# Patient Record
Sex: Male | Born: 1935 | Race: Black or African American | Hispanic: No | Marital: Married | Smoking: Current every day smoker
Health system: Southern US, Community
[De-identification: ages and names within clinical notes are randomized; demographics above are authoritative.]

## PROBLEM LIST (undated history)

## (undated) DIAGNOSIS — I739 Peripheral vascular disease, unspecified: Secondary | ICD-10-CM

## (undated) DIAGNOSIS — F1910 Other psychoactive substance abuse, uncomplicated: Secondary | ICD-10-CM

## (undated) DIAGNOSIS — E785 Hyperlipidemia, unspecified: Secondary | ICD-10-CM

## (undated) HISTORY — DX: Hyperlipidemia, unspecified: E78.5

## (undated) HISTORY — DX: Other psychoactive substance abuse, uncomplicated: F19.10

## (undated) HISTORY — DX: Peripheral vascular disease, unspecified: I73.9

---

## 2006-09-19 ENCOUNTER — Ambulatory Visit: Payer: Self-pay | Admitting: Internal Medicine

## 2007-08-08 ENCOUNTER — Ambulatory Visit: Payer: Self-pay | Admitting: Cardiology

## 2007-08-16 ENCOUNTER — Ambulatory Visit: Payer: Self-pay

## 2007-08-28 ENCOUNTER — Ambulatory Visit: Payer: Self-pay | Admitting: Cardiology

## 2007-08-28 LAB — CONVERTED CEMR LAB
Calcium: 10.2 mg/dL (ref 8.4–10.5)
Creatinine, Ser: 1.4 mg/dL (ref 0.4–1.5)
GFR calc Af Amer: 64 mL/min
Glucose, Bld: 96 mg/dL (ref 70–99)
HCT: 46 % (ref 39.0–52.0)
Hemoglobin: 15.8 g/dL (ref 13.0–17.0)
INR: 1 (ref 0.8–1.0)
MCHC: 34.5 g/dL (ref 30.0–36.0)
Monocytes Absolute: 0.3 10*3/uL (ref 0.1–1.0)
Monocytes Relative: 2.5 % — ABNORMAL LOW (ref 3.0–12.0)
Neutro Abs: 5.9 10*3/uL (ref 1.4–7.7)
RDW: 13.4 % (ref 11.5–14.6)
Sodium: 140 meq/L (ref 135–145)

## 2007-08-30 ENCOUNTER — Ambulatory Visit: Payer: Self-pay | Admitting: Cardiovascular Disease

## 2007-08-30 ENCOUNTER — Inpatient Hospital Stay (HOSPITAL_BASED_OUTPATIENT_CLINIC_OR_DEPARTMENT_OTHER): Admission: RE | Admit: 2007-08-30 | Discharge: 2007-08-30 | Payer: Self-pay | Admitting: Cardiovascular Disease

## 2007-09-18 ENCOUNTER — Ambulatory Visit: Payer: Self-pay | Admitting: Cardiovascular Disease

## 2007-09-18 ENCOUNTER — Ambulatory Visit: Payer: Self-pay | Admitting: Cardiology

## 2007-09-19 ENCOUNTER — Ambulatory Visit: Payer: Self-pay | Admitting: Vascular Surgery

## 2007-09-29 ENCOUNTER — Ambulatory Visit: Payer: Self-pay | Admitting: Internal Medicine

## 2007-09-29 DIAGNOSIS — E1169 Type 2 diabetes mellitus with other specified complication: Secondary | ICD-10-CM | POA: Insufficient documentation

## 2007-09-29 DIAGNOSIS — E785 Hyperlipidemia, unspecified: Secondary | ICD-10-CM | POA: Insufficient documentation

## 2007-09-29 DIAGNOSIS — J984 Other disorders of lung: Secondary | ICD-10-CM

## 2007-10-05 ENCOUNTER — Ambulatory Visit (HOSPITAL_COMMUNITY): Admission: RE | Admit: 2007-10-05 | Discharge: 2007-10-05 | Payer: Self-pay | Admitting: Internal Medicine

## 2007-12-21 ENCOUNTER — Ambulatory Visit: Payer: Self-pay

## 2008-03-01 HISTORY — PX: JOINT REPLACEMENT: SHX530

## 2008-04-02 ENCOUNTER — Telehealth (INDEPENDENT_AMBULATORY_CARE_PROVIDER_SITE_OTHER): Payer: Self-pay | Admitting: *Deleted

## 2008-04-10 ENCOUNTER — Ambulatory Visit: Payer: Self-pay | Admitting: Cardiology

## 2008-04-12 ENCOUNTER — Telehealth (INDEPENDENT_AMBULATORY_CARE_PROVIDER_SITE_OTHER): Payer: Self-pay | Admitting: *Deleted

## 2008-05-21 ENCOUNTER — Ambulatory Visit: Payer: Self-pay | Admitting: Vascular Surgery

## 2008-05-23 ENCOUNTER — Encounter: Admission: RE | Admit: 2008-05-23 | Discharge: 2008-05-23 | Payer: Self-pay | Admitting: Vascular Surgery

## 2008-06-24 ENCOUNTER — Inpatient Hospital Stay (HOSPITAL_COMMUNITY): Admission: RE | Admit: 2008-06-24 | Discharge: 2008-06-26 | Payer: Self-pay | Admitting: Orthopedic Surgery

## 2008-11-07 ENCOUNTER — Ambulatory Visit: Payer: Self-pay | Admitting: Cardiovascular Disease

## 2008-11-21 ENCOUNTER — Ambulatory Visit: Payer: Self-pay | Admitting: Internal Medicine

## 2009-02-12 ENCOUNTER — Ambulatory Visit: Payer: Self-pay | Admitting: Internal Medicine

## 2009-02-12 ENCOUNTER — Encounter: Payer: Self-pay | Admitting: Internal Medicine

## 2009-02-12 DIAGNOSIS — J441 Chronic obstructive pulmonary disease with (acute) exacerbation: Secondary | ICD-10-CM | POA: Insufficient documentation

## 2009-02-12 DIAGNOSIS — F17200 Nicotine dependence, unspecified, uncomplicated: Secondary | ICD-10-CM | POA: Insufficient documentation

## 2009-02-24 ENCOUNTER — Telehealth (INDEPENDENT_AMBULATORY_CARE_PROVIDER_SITE_OTHER): Payer: Self-pay | Admitting: *Deleted

## 2009-05-05 IMAGING — CT CT ANGIO AOBIFEM WO/W CM
2 of 5 series · 11 of 33 positions shown · IV contrast ([ID] OMNI 350)
Comparison: PET CT from 10/05/2007

CLINICAL DATA: 74-year-old with peripheral vascular disease.
Bilateral calf pain with walking.  History of femoral-femoral
bypass graft.

CT ANGIOGRAPHY OF ABDOMINAL AORTA WITH ILIOFEMORAL RUNOFF
TECHNIQUE: Multidetector CT imaging of the abdomen, pelvis and
lower extremities was performed using the standard protocol during
bolus administration of intravenous contrast. Multiplanar CT image
reconstructions including MIPs were obtained to evaluate the
vascular anatomy.
Contrast: 125 ml Nmnipaque-8AA 50

[Series 5: runoff · axial · 0.74mm/px · z∈[-1147,-119]mm · 10 of 485 slices shown]
[im 45/485  soft-tissue]
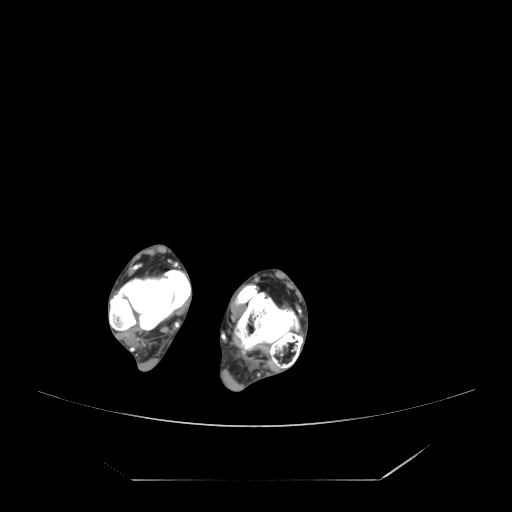
[im 89/485  bone]
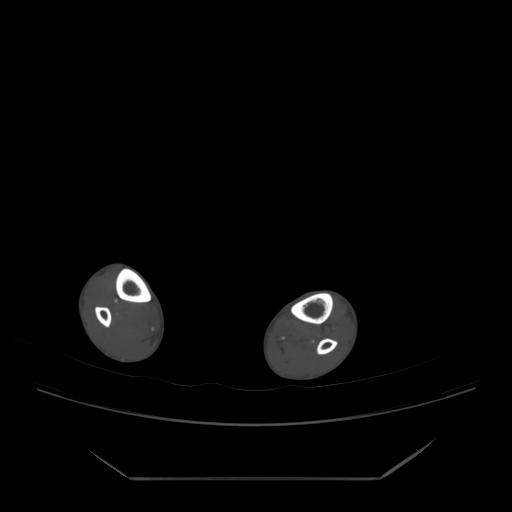
[im 133/485  soft-tissue]
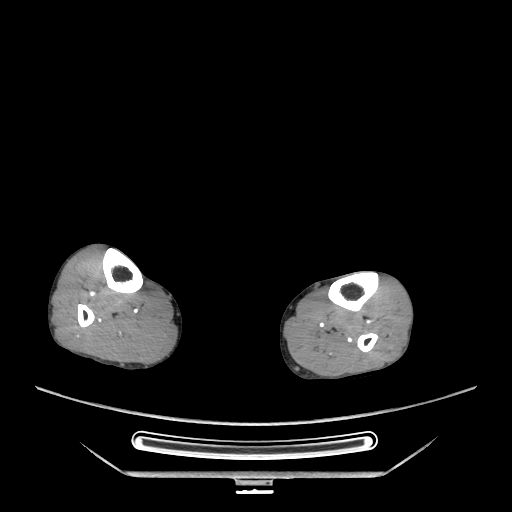
[im 177/485  bone]
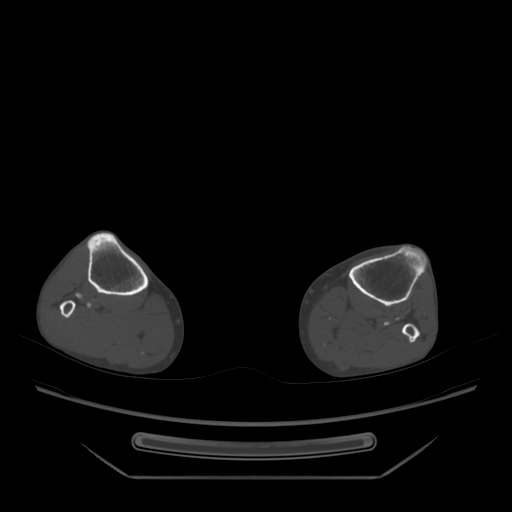
[im 221/485  soft-tissue]
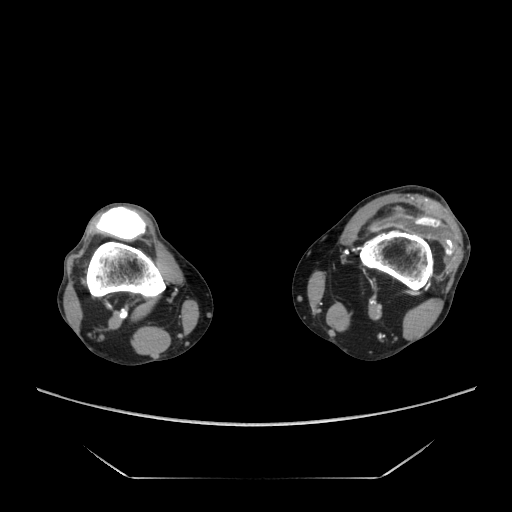
[im 265/485  bone]
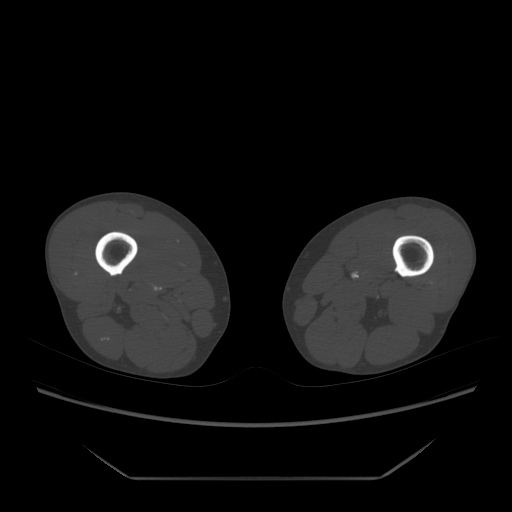
[im 309/485  soft-tissue]
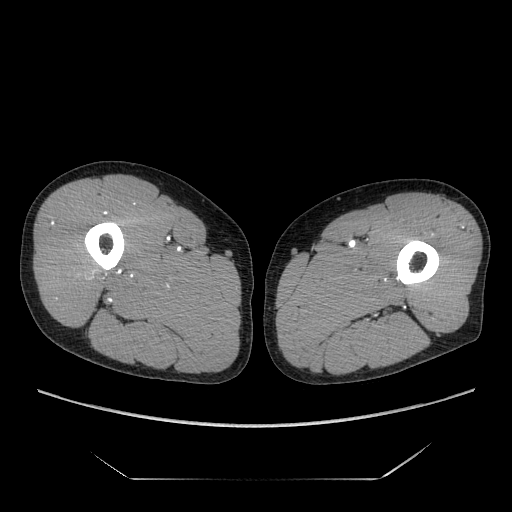
[im 353/485  bone]
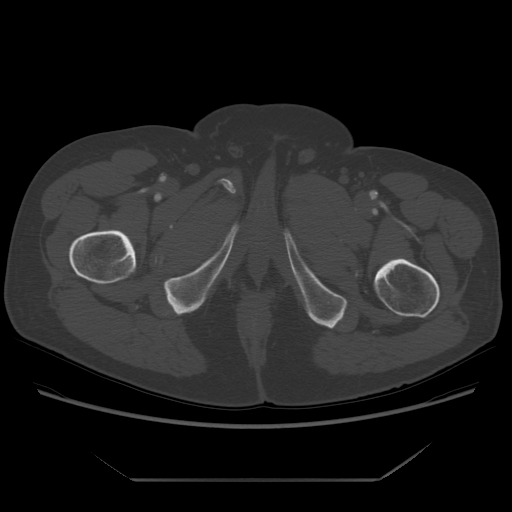
[im 397/485  soft-tissue]
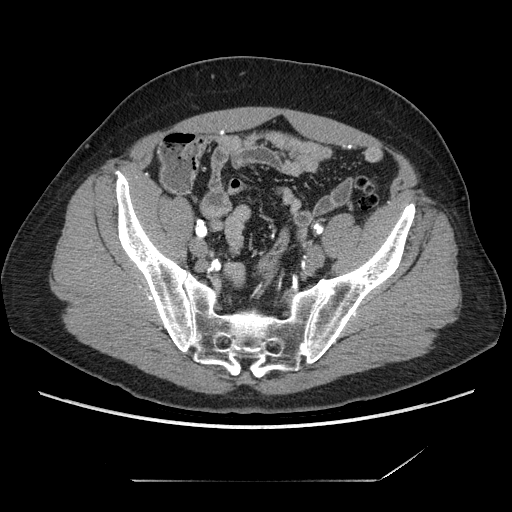
[im 441/485  bone]
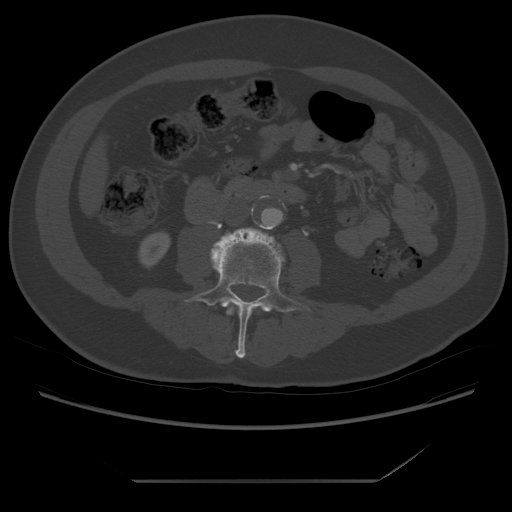

[Series 401: sag angio · sagittal · 2.50mm/px · 1 of 191 slices shown]
[im 96/191  soft-tissue]
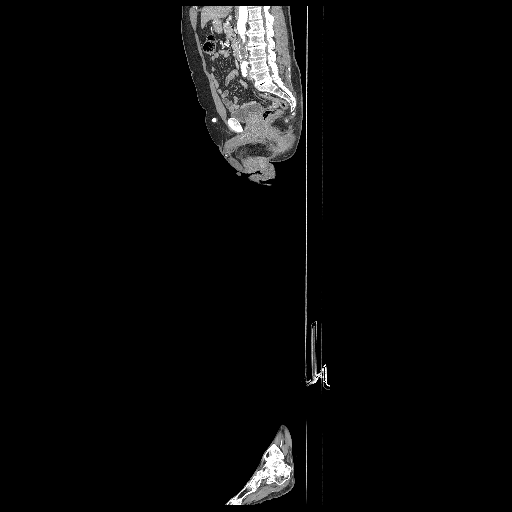

[11 of 33 positions shown; findings below may reference images not displayed]

FINDINGS: Aorta: The abdominal aorta is patent.  The celiac trunk, superior
mesenteric artery and bilateral renal arteries are widely patent.
There is probably distal reconstitution of the inferior mesenteric
artery because the origin of the IMA does not appear to be
opacified.  There is aneurysmal dilatation of the infrarenal
abdominal aorta with extensive mural thrombus particularly along
the anterior and lateral aspects.  The aorta measures 2.9 x 3 cm.
The aorta size has minimally change since 10/05/2007.  There is
complete occlusion of the left common iliac artery with distal
reconstitution of the left external and internal iliac arteries.
The proximal right common iliac artery measures 1.6 cm.  There may
be a focal dissection or focal mural thrombus involving the medial
aspect of the right common iliac artery.  There is mild narrowing
of the right common iliac artery with a large amount plaque
involving the right iliac bifurcation.  The right external and
internal iliac arteries are patent.  The patient does have a patent
femoral-femoral bypass graft.

Right Lower Extremity: There is a small area of dilatation along
the medial and posterior aspect of the right common femoral artery
which may represent a small pseudoaneurysm formation.  The proximal
right superficial femoral artery and profunda femoral arteries are
patent.  There is a segment of stenosis involving the mid right
superficial femoral artery that roughly measures 6 cm in length.
Just beyond the long segment stenosis, there is a critical area of
stenosis and occlusion of the superficial femoral artery with some
distal reconstitution.  The right popliteal artery is patent.
There is a three-vessel runoff in the right lower leg.  Flow across
ankle from the dorsalis pedis artery and posterior tibial artery.

Left Lower Extremity: The left common femoral artery is patent.
The proximal left superficial femoral artery and profunda femoral
arteries are patent.  There is mild narrowing of the distal left
SFA.  There is a critical area of stenosis near the adductor canal
and a short segment occlusion involving the proximal popliteal
artery.  There is three-vessel runoff in the left lower leg but the
anterior tibial artery is small and occludes distally.  There is
distal reconstitution of the dorsalis pedis artery and there is
flow identified in the plantar branches.  There are patchy areas of
lucency throughout the left foot and ankle compared to the right
side.  There is may be related to osteoporosis.  Degenerative
changes involving the left knee.  There is a left knee joint
effusion.  There is some ossification or calcification along the
lateral aspect of the left knee joint.

Nonvascular structures:  Evaluation of the intra-abdominal
structures is limited due to the arterial phase of contrast.  No
gross abnormality to the visualized aspects of the liver, stomach
and spleen.  There are high density structures within the
gallbladder suggestive for stones or sludge.  There is no gross
abnormality to the visualized pancreas, adrenal tissue, kidneys or
bowel structures.  There is no significant free fluid or
lymphadenopathy in the abdomen pelvis.  There is extensive
diverticulosis involving the descending colon but no evidence to
suggest acute inflammation.  The is wall thickening of the rectum
which is nonspecific and could be related to under distention.  The
patient has extensive fat surrounding the rectal region.  The
appendix has a normal appearance.  There appears to be scarring
along the lower pole of the left kidney.

 Review of the MIP images confirms the above findings.
IMPRESSION: Patent femoral-femoral bypass graft.  The left common iliac artery
is occluded.  The right iliac system is patent although there is
mild stenosis and irregularity in the right common iliac artery
with a large amount of plaque or a questionable thrombosed
dissection in this area.

Bilateral outflow disease demonstrated by a short segment occlusion
of the distal left SFA and proximal popliteal artery.  In addition,
there is a long segment stenosis involving the right SFA with a
short segment occlusion in the distal right SFA.

Three-vessel runoff to the right foot and two-vessel runoff to the
left foot.

Probable osteoporosis of the left ankle and foot.  Degenerative
changes and joint fluid involving the left knee.

Scarring along the lower pole left kidney.  Question previous
inflammation or reflux disease in this area.

Nonspecific bowel wall thickening in the rectal region.  The
patient also has a extensive diverticulosis without acute
inflammation.

Probable cholelithiasis.

Abdominal aortic aneurysm measuring up to 3 cm.

## 2009-05-12 ENCOUNTER — Telehealth (INDEPENDENT_AMBULATORY_CARE_PROVIDER_SITE_OTHER): Payer: Self-pay | Admitting: *Deleted

## 2009-06-03 ENCOUNTER — Ambulatory Visit: Payer: Self-pay | Admitting: Internal Medicine

## 2009-11-19 ENCOUNTER — Emergency Department (HOSPITAL_COMMUNITY): Admission: EM | Admit: 2009-11-19 | Discharge: 2009-11-19 | Payer: Self-pay | Admitting: Family Medicine

## 2009-12-03 ENCOUNTER — Telehealth (INDEPENDENT_AMBULATORY_CARE_PROVIDER_SITE_OTHER): Payer: Self-pay | Admitting: *Deleted

## 2009-12-09 ENCOUNTER — Ambulatory Visit: Payer: Self-pay | Admitting: Internal Medicine

## 2010-04-01 NOTE — Progress Notes (Signed)
----   Converted from flag ---- ---- 06/03/2009 11:50 AM, Nyoka Cowden MD wrote: needs f/u cxr/ ov by now ------------------------------  Spoke with pt and he is already sched for appt with MW for 12/09/09.  Pt states will keep appt. Vernie Murders  December 03, 2009 4:50 PM

## 2010-04-01 NOTE — Progress Notes (Signed)
Summary: needs f/up with cxr-done  ---- Converted from flag ---- ---- 02/12/2009 10:07 AM, Nyoka Cowden MD wrote: ov with cxr due by now ------------------------------  Barnetta Chapel  May 12, 2009 9:58 AM  Spoke with pt.  He had already called back and was sched for appt with MW for 06/03/09.  Pt currently doing fine so advised okay to wait until then for ov. Vernie Murders  May 14, 2009 12:21 PM

## 2010-04-01 NOTE — Assessment & Plan Note (Signed)
Summary: Pulmonary/ f/u LUL nodule   Copy to:  Dr. Daleen Squibb Primary Provider/Referring Provider:  Dr. Aleatha Borer  CC:  6 month followup with cxr.  Pt states that is breathing is overall okay. No complaints today. Still smoking 1/2 ppd..  History of Present Illness: 77 yobm smoker with incidental lung nodule by CXR  08/28/07 confirmed on ct @ 11mm in LUL.  November 21, 2008 ov for f/u ov still no sign symptoms.  February 12, 2009 3 month followup.  Pt states that breathing is the same.  He c/o prod cough in the am with white sputum.  Still smoking about 1 ppd.   rec no smoking  June 03, 2009 3 month followup with cxr.  Pt states no new complaints today- breathing is the same.  Still smoking about 1 ppd.  no sob or cough.  rec stop smoking, f/u 6 mon  December 09, 2009 6 month followup with cxr.  Pt states that is breathing is overall okay. No complaints today. Still smoking 1/2 ppd. Pt denies any significant sore throat, dysphagia, itching, sneezing,  nasal congestion or excess secretions,  fever, chills, sweats, unintended wt loss, pleuritic or exertional cp, hempoptysis, change in activity tolerance  orthopnea pnd or leg swelling.  Current Medications (verified): 1)  Lipitor 80 Mg  Tabs (Atorvastatin Calcium) .Marland Kitchen.. 1 At Bedtime 2)  Plavix 75 Mg  Tabs (Clopidogrel Bisulfate) .... Once Daily 3)  Metformin Hcl 500 Mg  Tabs (Metformin Hcl) .... Once Daily  Allergies (verified): No Known Drug Allergies  Past History:  Past Medical History: Diabetes Hyperlipidemia PVD s/p FEM-FEM [place] IHD: Last catheterization 08/30/2007 indicating well-preserved LV function LVEDP of 28 and single vessel disease of the circumflex with good collaterals, rec medical rx SPN  LUL first detected 07/2007     - PET neg 10/05/2007     - Re CT 11/07/08 no sign change     - Final f/u cxr  December 09, 2009 >> no change >> rec yearly f/u   COPD    - PFT's February 12, 2009 FEV1 2.58 (91%) 57% with no improvement after B2  and DLC0 57  Vital Signs:  Patient profile:   77 year old male Weight:      175.38 pounds BMI:     25.26 O2 Sat:      94 % on Room air Temp:     97.9 degrees F oral Pulse rate:   80 / minute BP sitting:   116 / 70  (left arm)  Vitals Entered By: Vernie Murders (December 09, 2009 11:04 AM)  O2 Flow:  Room air  Physical Exam  Additional Exam:   Healthy-appearing ambulatory  elderly black male in no acute distress wt   160 November 21, 2008 > 173 February 12, 2009 > 182 June 03, 2009 > 175 December 09, 2009  HEENT mild turbinate edema.  Oropharynx with full set of dentures no thrush or excess pnd or cobblestoning.  No JVD or cervical adenopathy. Mild accessory muscle hypertrophy. Trachea midline, nl thryroid. Chest was hyperinflated by percussion with diminished breath sounds and moderate increased exp time without wheeze. Hoover sign positive at mid inspiration. Regular rate and rhythm without murmur gallop or rub or increase P2. No edema  Abd: no hsm, nl excursion. Ext warm without cyanosis or clubbing     Impression & Recommendations:  Problem # 1:  COPD UNSPECIFIED (ICD-496) GOLD I COPD but still smoking    As long  as no symtoms or tendency to exac then no need for rx   I took this opportunity to educate the patient regarding the consequences of smoking in airway disorders based on all the data we have from the multiple national lung health studies indicating that smoking cessation, not choice of inhalers or physicians, is the most important aspect of care.    Problem # 2:  SMOKER (ICD-305.1)  Discussed but not ready to committ to quit at this point - emphasized risks involved in continuing smoking and that patient should consider these in the context of the cost of smoking relative to the benefit obtained.     Problem # 3:  PULMONARY NODULE (ICD-518.89) No change x 2 years so ok to just do yearly cxr as part of physical exam  Medications Added to Medication List This  Visit: 1)  Lipitor 80 Mg Tabs (Atorvastatin calcium) .Marland Kitchen.. 1 at bedtime  Other Orders: T-2 View CXR (71020TC) Est. Patient Level IV (41324)  Patient Instructions: 1)  Committ to quit smoking 2)  Your xrays have been for 2 years now so I recommend an annual cxr in the fall either here or with Dr Aleatha Borer

## 2010-04-01 NOTE — Assessment & Plan Note (Signed)
Summary: Pulmonary/ ext f/u ov    Copy to:  Dr. Daleen Squibb Primary Provider/Referring Provider:  Dr. Kathrynn Running  CC:  3 month followup with cxr.  Pt states no new complaints today- breathing is the same.  Still smoking about 1 ppd..  History of Present Illness: 53 yobm smoker with incidental lung nodule by CXR  08/28/07 confirmed on ct @ 11mm in LUL.  November 21, 2008 ov for f/u ov still no sign symptoms.  February 12, 2009 3 month followup.  Pt states that breathing is the same.  He c/o prod cough in the am with white sputum.  Still smoking about 1 ppd.   rec no smoking  June 03, 2009 3 month followup with cxr.  Pt states no new complaints today- breathing is the same.  Still smoking about 1 ppd.  no sob or cough.  Pt denies any significant sore throat, dysphagia, itching, sneezing,  nasal congestion or excess secretions,  fever, chills, sweats, unintended wt loss, pleuritic or exertional cp, hempoptysis, change in activity tolerance  orthopnea pnd or leg swelling   Current Medications (verified): 1)  Lipitor 80 Mg  Tabs (Atorvastatin Calcium) .... At Bedtime. 2)  Plavix 75 Mg  Tabs (Clopidogrel Bisulfate) .... Once Daily 3)  Metformin Hcl 500 Mg  Tabs (Metformin Hcl) .... Once Daily  Allergies (verified): No Known Drug Allergies  Past History:  Past Medical History: Diabetes Hyperlipidemia PVD s/p FEM-FEM [place] IHD: Last catheterization 08/30/2007 indicating well-preserved LV function LVEDP of 28 and single vessel disease of the circumflex with good collaterals, rec medical rx SPN  LUL first detected 07/2007     - PET neg 10/05/2007     - Re CT 11/07/08 no sign change     - Tickle file for recall 12/03/09 COPD    - PFT's February 12, 2009 FEV1 2.58 (91%) 57% with no improvement after B2 and DLC0 57  Vital Signs:  Patient profile:   77 year old male Weight:      182 pounds O2 Sat:      95 % on Room air Temp:     976 degrees F oral Pulse rate:   88 / minute BP sitting:   124 / 70  (left  arm)  Vitals Entered By: Vernie Murders (June 03, 2009 11:37 AM)  O2 Flow:  Room air  Physical Exam  Additional Exam:   Healthy-appearing ambulatory  elderly black male in no acute distress wt   160 November 21, 2008 > 173 February 12, 2009 > 182 June 03, 2009  HEENT mild turbinate edema.  Oropharynx with full set of dentures no thrush or excess pnd or cobblestoning.  No JVD or cervical adenopathy. Mild accessory muscle hypertrophy. Trachea midline, nl thryroid. Chest was hyperinflated by percussion with diminished breath sounds and moderate increased exp time without wheeze. Hoover sign positive at mid inspiration. Regular rate and rhythm without murmur gallop or rub or increase P2. No edema  Abd: no hsm, nl excursion. Ext warm without cyanosis or clubbing     CXR  Procedure date:  06/03/2009  Findings:      Comparison: CT 11/07/2008   Findings: Left upper lobe pulmonary nodule is again noted, stable. Linear scarring or atelectasis in the lung bases.  Heart is normal size.  No effusions or acute bony abnormality.   IMPRESSION: Stable left upper lobe nodule.  Impression & Recommendations:  Problem # 1:  PULMONARY NODULE (ICD-518.89)  No change since 6/09, final  recall for 11/2009  Discussed in detail all the  indications, usual  risks and alternatives  relative to the benefits with patient who agrees to proceed with conservative f/u  Problem # 2:  SMOKER (ICD-305.1)  > 3 min discussion.  Discussed but not ready to committ to quit at this point - emphasized risks involved in continuing smoking and that patient should consider these in the context of the cost of smoking relative to the benefit obtained.   Orders: Est. Patient Level IV (16109)  Problem # 3:  COPD UNSPECIFIED (ICD-496) Pft's reviewed with pt.   I took this opportunity to educate the patient regarding the consequences of smoking in airway disorders based on all the data we have from the multiple national lung  health studies indicating that smoking cessation, not choice of inhalers or physicians, is the most important aspect of care.    Other Orders: T-2 View CXR (71020TC)  Patient Instructions: 1)  Please schedule a follow-up appointment in 6 months with cxr 2)  Committ to quit smoking

## 2010-06-10 LAB — GLUCOSE, CAPILLARY
Glucose-Capillary: 142 mg/dL — ABNORMAL HIGH (ref 70–99)
Glucose-Capillary: 144 mg/dL — ABNORMAL HIGH (ref 70–99)
Glucose-Capillary: 148 mg/dL — ABNORMAL HIGH (ref 70–99)
Glucose-Capillary: 151 mg/dL — ABNORMAL HIGH (ref 70–99)
Glucose-Capillary: 158 mg/dL — ABNORMAL HIGH (ref 70–99)
Glucose-Capillary: 165 mg/dL — ABNORMAL HIGH (ref 70–99)

## 2010-06-10 LAB — CBC
HCT: 33.5 % — ABNORMAL LOW (ref 39.0–52.0)
Hemoglobin: 11.5 g/dL — ABNORMAL LOW (ref 13.0–17.0)
MCHC: 33.5 g/dL (ref 30.0–36.0)
MCHC: 33.6 g/dL (ref 30.0–36.0)
MCV: 90.9 fL (ref 78.0–100.0)
MCV: 91 fL (ref 78.0–100.0)
Platelets: 205 10*3/uL (ref 150–400)
Platelets: 224 10*3/uL (ref 150–400)
RBC: 3.96 MIL/uL — ABNORMAL LOW (ref 4.22–5.81)
RDW: 14.4 % (ref 11.5–15.5)
RDW: 14.5 % (ref 11.5–15.5)
RDW: 14.7 % (ref 11.5–15.5)
WBC: 10.7 10*3/uL — ABNORMAL HIGH (ref 4.0–10.5)
WBC: 11.6 10*3/uL — ABNORMAL HIGH (ref 4.0–10.5)

## 2010-06-10 LAB — HEMOGLOBIN A1C
Hgb A1c MFr Bld: 7.1 % — ABNORMAL HIGH (ref 4.6–6.1)
Mean Plasma Glucose: 157 mg/dL

## 2010-06-10 LAB — BASIC METABOLIC PANEL
BUN: 9 mg/dL (ref 6–23)
CO2: 26 mEq/L (ref 19–32)
Calcium: 8.8 mg/dL (ref 8.4–10.5)
Calcium: 9.2 mg/dL (ref 8.4–10.5)
Chloride: 101 mEq/L (ref 96–112)
Creatinine, Ser: 1.19 mg/dL (ref 0.4–1.5)
GFR calc Af Amer: >60 mL/min (ref >60)
GFR calc Af Amer: >60 mL/min (ref >60)
GFR calc non Af Amer: 50 mL/min — ABNORMAL LOW (ref >60)
Glucose, Bld: 141 mg/dL — ABNORMAL HIGH (ref 70–99)
Potassium: 4.3 mEq/L (ref 3.5–5.1)
Sodium: 133 mEq/L — ABNORMAL LOW (ref 135–145)

## 2010-06-10 LAB — URINE CULTURE
Colony Count: NO GROWTH
Culture: NO GROWTH

## 2010-06-10 LAB — PROTIME-INR: INR: 1.3 (ref 0.00–1.49)

## 2010-06-10 LAB — URINALYSIS, ROUTINE W REFLEX MICROSCOPIC
Glucose, UA: NEGATIVE mg/dL
Nitrite: NEGATIVE
Protein, ur: NEGATIVE mg/dL
pH: 5.5 (ref 5.0–8.0)

## 2010-06-10 LAB — DIFFERENTIAL
Eosinophils Relative: 4 % (ref 0–5)
Lymphocytes Relative: 36 % (ref 12–46)
Lymphs Abs: 3.9 10*3/uL (ref 0.7–4.0)
Monocytes Absolute: 0.9 10*3/uL (ref 0.1–1.0)
Neutro Abs: 5.5 10*3/uL (ref 1.7–7.7)

## 2010-06-10 LAB — COMPREHENSIVE METABOLIC PANEL
AST: 22 U/L (ref 0–37)
Albumin: 4 g/dL (ref 3.5–5.2)
Calcium: 10.4 mg/dL (ref 8.4–10.5)
Chloride: 106 mEq/L (ref 96–112)
Creatinine, Ser: 1.32 mg/dL (ref 0.4–1.5)
GFR calc Af Amer: >60 mL/min (ref >60)
Total Protein: 7.3 g/dL (ref 6.0–8.3)

## 2010-06-10 LAB — TYPE AND SCREEN: Antibody Screen: NEGATIVE

## 2010-06-10 LAB — APTT: aPTT: 27 seconds (ref 24–37)

## 2010-07-14 NOTE — Assessment & Plan Note (Signed)
[facility]                            CARDIOLOGY OFFICE NOTE   NAME:Philip Gray, Philip Gray                          MRN:          [id number]  DATE:12/21/2007                            DOB:          [birth year]    Mr. Zeitz returns today for followup of his coronary artery disease.  He  offers no complaints.  He is extremely passive and lackadaisical, I am  not sure this time much he does anyway.  He sits most of the today and  watches TV, as my earlier notes delineate.   He did see Dr. Sharyn Lull for his peripheral vascular disease.  Doppler study showed an ABI of 62% on the right, 55% on the left.  Dr.  Edilia Bo recommended a conservative care.  He felt his fem-fem graft was  functioning well.  Again, Mr. Haskett offers no complaints.   We arranged him to see Dr. Marchelle Gearing in Pulmonary about a left upper  lobe subpleural nodule suspicious for neoplasm in a smoker.  He did not  go.   His meds are:  1. Lipitor 80 mg q.h.s.  2. Plavix 75 mg a day.  3. Metformin 500 mg a day.   He does not check his blood sugars.   His blood pressure today is 130/80, his pulse 70 and regular, his weighs  174, which is up 3 pounds.  HEENT:  Unchanged.  Carotids are full.  No bruits.  Thyroid is not  enlarged.  Trachea is midline.  LUNGS:  Decreased breath sounds with no rhonchi or rales.  HEART:  Soft S1 and S2.  ABDOMEN:  Soft.  Good bowel sounds.  No midline bruit.  EXTREMITIES:  No edema.  Pulses are palpable, but very trace.   EKG is essentially normal.   ASSESSMENT AND PLAN:  Ms. Schlafer is stable from our standpoint.  He is not  taking an active role in his health, not checking his blood sugar, not  going to his appointments, and continued to smoke.  I made it clear to  him today that he needs the Pulmonary, we will arrange an appointment  and it is up to him to keep it.  I will see him back in 6 months.     Thomas C. Daleen Squibb, MD, [facility]  Electronically Signed    TCW/MedQ  DD: 12/21/2007  DT: 12/21/2007  Job #: [id number]   cc:   Aleatha Borer, MD

## 2010-07-14 NOTE — Assessment & Plan Note (Signed)
[facility]                            CARDIOLOGY OFFICE NOTE   Philip, Gray                          MRN:          [id number]  DATE:08/08/2007                            DOB:          1933/07/25    I was asked by Dr. Aleatha Borer to consult and evaluate Philip Gray for  his peripheral vascular disease.   HISTORY OF PRESENT ILLNESS:  He is a 77 year old black male who  apparently had a fem-fem bypass for claudication several years ago.  This was done up in [place].  I do not have records.   He still has some claudication in his left calf.  He sounds like he does  not ambulate very much.  He says he sits in front of the TV most the  time and does not push himself.   He is having some dyspnea on exertion, however.  He denies any true  angina.  He has no orthopnea, PND or peripheral edema.  He has had no  history of DVT.   Unfortunately he continues smoke about a pack of cigarettes a day.  He  has hyperlipidemia, he has type 2 diabetes.  His hemoglobin A1c was  recently checked at 7%.  He takes Lipitor and Plavix.   PAST MEDICAL HISTORY:  NO KNOWN DRUG ALLERGIES.   He is currently on:  1. Lipitor 80 mg nightly.  2. Plavix 75 mg a day.  3. Metformin 5 mg a day.   Again, his cardiac risk factors include age, male sex, peripheral  vascular disease, tobacco use, low HDL of 37, LDL of 112, this may have  been when he was not taking his Lipitor because it has been 62 in the  past, and type 2 diabetes.   He does not drink alcohol.  He enjoys coffee.   He has had surgery in 2004 as mentioned above but no other surgery.   FAMILY HISTORY:  Noncontributory for premature coronary disease.   SOCIAL HISTORY:  Retired March 02, 1995.  He is married, has one child.  He says his wife actually died last year so he must have remarried.   REVIEW OF SYSTEMS:  Other than the HPI all systems queried and are  negative.   EXAM:  His blood pressure is  147/76 in the left arm with large cuff, his  pulse 76 and regular.  EKG shows normal sinus rhythm with a rightward  axis and some PACs.  His weight is 172, he is 5 foot 10 inches.  He  looks younger than stated age, his skin is warm and dry and largely free  of wrinkles.  He is alert and oriented x3, he is pleasant.  HEENT:  Sclerae are slightly muddy, PERRLA, extraocular movements  intact, facial symmetry is normal.  He wears dentures.  NECK:  Supple, carotid upstrokes were equal bilateral without bruits, no  JVD, thyroid is not enlarged, trachea is midline.  LUNGS:  Clear without rhonchi or wheezes.  HEART:  Reveals a S4 at the apex, he has normal S1-S2 without obvious  murmur.  ABDOMINAL:  Soft, good bowel sounds, no midline bruit.  There is no  hepatomegaly though I can feel his liver edge with inspiration.  There  is no tenderness.  EXTREMITIES:  Reveal femoral pulses to be intact.  His distal pulses  were trace to 1+ by the dorsalis pedis and posterior tibial particularly  on the left.  He has no sign of DVT.  There is no edema.  NEURO:  Exam is intact.  SKIN:  Unremarkable.   ASSESSMENT:  [name] has peripheral vascular disease with some  claudication in the left calf.  He is also having dyspnea on exertion  and with all his risk factor, particularly PVD, I am sure he has  coronary disease.  He tells me that he would like to have his left knee  operated under general anesthesia, he has not been to orthopedic surgeon  yet.   PLAN:  1. Adenosine rest stress Myoview to rule out obstructive coronary      disease.  2. Peripheral arterial Dopplers.  3. Continue his current medical program with tighter control of his      sugar and compliance with his Lipitor.  4. Decrease cigarettes or quit.  5. See me back in a year if his studies are negative.   If his stress Myoview is negative and his peripheral vast studies are  stable with good tissue flow we will approve him for  surgery.     Philip C. Daleen Squibb, MD, [facility]  Electronically Signed    TCW/MedQ  DD: 08/08/2007  DT: 08/08/2007  Job #: [id number]   cc:   Aleatha Borer, MD

## 2010-07-14 NOTE — Consult Note (Signed)
NEW PATIENT CONSULTATION   LASZLO, ELLERBY  DOB:  1933-12-09                                       09/19/2007  ZOXWR#:[id number]   I saw the patient in the office today in consultation concerning his  bilateral peripheral vascular disease.  He is referred by Dr. Daleen Squibb.  This is a pleasant 77 year old gentleman with a long history of  peripheral vascular disease.  In 2004 he underwent a right to left fem-  fem bypass graft in [place].  This was for significant left lower  extremity claudication.  His symptoms improved, but over the years he  has gradually developed bilateral lower extremity claudication again,  more significantly on the left side.  He experiences pain in both calves  at approximately 1 block.  These symptoms are relieved with rest.  Symptoms are aggravated by walking.  He does not give any significant  history of thigh or hip claudication.  He also has some arthritic pain  in the left knee.  He denies any history of rest pain and has had no  history of nonhealing ulcers in his feet.   PAST MEDICAL HISTORY:  Significant for non-insulin-dependent diabetes  and hypercholesterolemia.  He denies any history of hypertension,  history of previous myocardial infarction, history of congestive heart  failure or history of COPD.   FAMILY HISTORY:  There is no history of premature cardiovascular  disease.   SOCIAL HISTORY:  He is married.  He smokes half a pack to 1 pack per day  of cigarettes and has been smoking for as long as he can remember.  He  does not use alcohol on a regular basis.   REVIEW OF SYSTEMS AND MEDICATIONS:  Documented on the medical history  form in his chart.   PHYSICAL EXAMINATION:  This is a pleasant 77 year old gentleman who  appears his stated age.  Blood pressure is 126/77, heart rate is 60.  Neck is supple.  There is no cervical lymphadenopathy.  I do not detect  any carotid bruits.  Lungs are clear bilaterally to  auscultation.  On  cardiac exam, he has a regular rate and rhythm.  Abdomen:  Soft and  nontender.  I cannot palpate an aneurysm.  He has normal femoral pulses.  His groin incisions are healed.  I cannot palpate popliteal or pedal  pulses on either side.  He does have monophasic Doppler signals in both  feet.  Both feet appear adequately perfused without ischemic ulcers.  Neurologic:  Exam is nonfocal.   Doppler study at the [facility] office on 08/16/2007 showed an ABI of 62%  on the right and 55% on the left.   Based on his exam, he has evidence of bilateral infrainguinal arterial  occlusive disease.  His fem-fem graft is functioning well.  I have  explained that we generally would not consider arteriography and  revascularization unless he develops disabling claudication, rest pain,  or nonhealing ulcer.  I have encouraged him to stay as active as  possible and to do as much walking as possible.  We have also discussed  the importance of tobacco cessation.  I will be happy to see him back at  any time if his symptoms progress.   Di Kindle. Edilia Bo, M.D.  Electronically Signed   CSD/MEDQ  D:  09/19/2007  T:  09/20/2007  Job:  [id number]   cc:   Thomas C. Wall, MD, [facility]

## 2010-07-14 NOTE — Op Note (Signed)
NAME:  Philip Gray, Philip Gray                 ACCOUNT NO.:  [id number]   MEDICAL RECORD NO.:  [id number]          PATIENT TYPE:  INP   LOCATION:  5008                         FACILITY:  [facility]   PHYSICIAN:  Elana Alm. Thurston Hole, M.D. DATE OF BIRTH:  Dec 12, 1933   DATE OF PROCEDURE:  06/24/2008  DATE OF DISCHARGE:                               OPERATIVE REPORT   PREOPERATIVE DIAGNOSIS:  Left knee degenerative joint disease.   POSTOPERATIVE DIAGNOSIS:  Left knee degenerative joint disease.   PROCEDURES:  Left total knee replacement using DePuy cemented total knee  system with #4 cemented femur, #4 cemented tibia with 15-mm polyethylene  RP tibial spacer, and 35-mm polyethelene cemented patella.   SURGEON:  Elana Alm. Thurston Hole, MD   ASSISTANT:  Julien Girt, PA.   ANESTHESIA:  General.   OPERATIVE TIME:  One hour and 30 minutes.   COMPLICATIONS:  None.   DESCRIPTION OF PROCEDURE:  Mr. Boschert was brought to the operating room on  June 24, 2008.  He was placed operating table in supine position.  He  received Ancef 1 g IV preoperatively for prophylaxis.  After being  placed under general anesthesia, a Foley catheter was placed under  sterile conditions.  His left knee was examined.  He had range of motion  from -6 to 125 degrees with moderate varus deformity, knee stable,  ligamentous exam with normal patellar tracking.  The knee was sterilely  injected with 0.25% Marcaine with epinephrine.  A tourniquet was not  used due to the patient's peripheral vascular disease; although, he had  been cleared preoperatively by Dr. Edilia Bo of Vascular Surgery.  The  left leg was then prepped using sterile DuraPrep and draped using  sterile technique.  Originally, through a 15-cm longitudinal incision  based over the patella, initial exposure was made.  The underlying  subcutaneous tissues were incised along the skin incision.  A median  arthrotomy was performed revealing an excessive amount of normal-  appearing joint fluid.  Synovial bleeders were cauterized.  The  articular surfaces were inspected.  He had grade 4 changes medially,  grade 3 and 4 changes laterally, and grade 3 and 4 changes in the  patellofemoral joint.  Large osteophytes were removed from the femoral  condyles and tibial plateau.  The medial and lateral meniscal remnants  were removed as well as the anterior cruciate ligament.  An  intramedullary drill was then drilled up the femoral canal for placement  of distal femoral cutting jig, which was placed in the appropriate  amount of rotation and distal 11-mm cut was made.  The distal femur was  then sized.  The #4 was found to be the appropriate size.  The #4  cutting jig was placed in the appropriate amount of external rotation  and then these cuts were made.  The proximal tibia was then exposed.  The tibial spines were removed with an oscillating saw.  Intramedullary  drill was drilled down the tibial canal for placement of the proximal  tibial cutting jig, which was placed in the appropriate amount of  rotation and a  proximal 6-mm cut was made based off the medial or lower  side.  Spacer blocks were then placed in flexion and extension.  A 15-mm  blocks gave excellent balancing, excellent stability, and excellent  correction of his flexion and varus deformities.  At this point, the #4  tibial base plate trial was placed on the cut tibial surface with an  excellent fit and the keel cut was made.  The PCL box cutter was then  placed on the distal femur and these cuts were made.  At this point, the  #4 femoral trial was placed with a #4 tibial base plate trial on a 15-mm  polyethelene RP tibial spacer.  The knee was reduced, taken through  range of motion from 0 to 125 degrees with excellent stability and  excellent correction of his flexion and varus deformities and normal  patellar tracking.  At this point, a resurfacing 10-mm cut was made on  the patella and 3  locking holes placed for a 35-mm patella trial, which  was then placed.  Again, the patellofemoral tracking was evaluated and  found to be normal.  At this point, it was felt that all the trial  components were of excellent size, fit, and stability.  They were then  removed.  The knee was then jet lavaged, irrigated with 3 L of saline.  Epinephrine-soaked sponges were used to keep any punctate bleeding from  the cut femoral and tibial surfaces and then the #4 cement backed tibial  component was hammered into position with an excellent fit with excess  cement being removed from around the edges.  The #4 femoral component  with cement backing was hammered into position also with an excellent  fit with excess cement being removed from around the edges.  The 15-mm  polyethylene RP tibial spacer was placed on the tibial base plate and  the knee reduced, taken through full range of motion, found to be stable  with excellent correction of his flexion and varus deformities.  The 35-  mm polyethylene cement back patella was then placed in its position and  held there with a clamp.  After the cement hardened, again  patellofemoral tracking was evaluated and found to be normal.  At this  point, it was felt that all the components were of excellent size, fit,  and stability.  The wound was further irrigated with saline.  The  arthrotomy was then closed with #1 Ethibond suture over 2 medium Hemovac  drains.  Subcutaneous tissue was closed with 0 and 2-0 Vicryl.  Subcuticular layer was closed with 4-0 Monocryl.  Sterile dressings and  a long leg splint applied.  The patient then awakened, extubated, and  taken to recovery room in stable condition.  Needle and sponge counts  correct x2 at the end of case.  Neurovascular status and Doppler  evaluation for pulses showed these to be equal.  The patient then taken  to the recovery room in stable condition.      Robert A. Thurston Hole, M.D.  Electronically  Signed     RAW/MEDQ  D:  06/24/2008  T:  06/25/2008  Job:  [id number]

## 2010-07-14 NOTE — Assessment & Plan Note (Signed)
[facility]                            CARDIOLOGY OFFICE NOTE   Philip, Gray                          MRN:          [id number]  DATE:08/28/2007                            DOB:          Nov 10, 1933    [name] returns today for followup.  This is an H&P for an outpatient  cardiac catheterization.   CHIEF COMPLAINT:  Legs ache when I walk.  This is mostly my left calf.   HISTORY OF PRESENT ILLNESS:  [name] is a 77 year old black male who  saw me initially in consultation on August 08, 2007.  He has had a history  of peripheral vascular disease who is status post a FEM-FEM bypass in  [place] several years ago.   Unfortunately, he continues to smoke and is fairly sedentary.  He has  significant claudication in his left calf when he walks.   He gave me a history of dyspnea on exertion.  We ordered a stress  Myoview to rule out any obstructive coronary disease.  This showed mild  inferior wall ischemia, EF 55%.  This was an adenosine study.   His past medical history, he has no known drug allergies.   Current meds are Lipitor 80 mg nightly, Plavix 75 mg a day, and  metformin 500 mg a day.   His cardiac risk factors include age, male sex, peripheral vascular  disease, tobacco use, low HDL of 37, LDL of 112.   He does not drink alcohol.  He enjoys coffee.   He has had no other surgeries.   FAMILY HISTORY:  Noncontributory for premature coronary disease.   SOCIAL HISTORY:  Retired March 02, 1995.  He is married to Philip Gray  who works in [employer].   REVIEW OF SYSTEMS:  Other than HPI, all systems were queried and are  negative.   PHYSICAL EXAMINATION:  GENERAL:  He is very pleasant.  He is alert and  oriented x3.  He looks younger than stated age.  SKIN:  Warm and dry.  VITAL SIGNS:  His blood pressure is 128/72, his pulse is 88 and regular,  his weight 173, his height is 5 feet 3 inches.  HEENT:  Sclerae are slightly muddy.   PERRLA.  Extraocular movements  intact.  Facial symmetry is normal.  He wears dentures.  NECK:  Supple.  Carotids upstrokes equal bilateral without bruits.  No JVD.  Thyroid is  not enlarged.  Trachea is midline.  LUNGS:  Clear without rhonchi or wheezes.  HEART:  Reveals an S4 at the apex.  He has normal S1 and S2.  ABDOMEN:  Soft.  Good bowel sounds.  No midline bruit.  There is no  hepatomegaly.  EXTREMITIES:  Reveal femoral pulses to be intact.  His distal pulses  were trace to 1+, dorsalis pedis and posterior tibial particularly on  the Left.  He has no sign of DVT.  There is no edema.  NEURO:  Intact.  SKIN:  Unremarkable.   We also obtained peripheral arterial Dopplers, which showed an ABI 0.55  on  the left, 0.62 on the right.  This is suggestion of a total occlusion  of the left posterior femoral artery at the ostium.  He has perfused  calcified plaque throughout both extremities.   ASSESSMENT:  1. Obstructive coronary artery disease with inferior wall ischemia.      With his multiple risk factors, he may have multivessel disease.      He has sustained left ventricular function.  2. Severe peripheral vascular disease with symptomatic claudication of      the left calf with minimal activity.  3. Ongoing tobacco use.  4. Hyperlipidemia with question of compliance.  5. Type 2 diabetes.   I had a long discussion with him and his wife today.  I have recommend  the following:  1. Outpatient cardiac catheterization.  2. Vascular surgery consultation.  3. Begin aspirin 81-324 mg per day.  4. Continue Plavix.  5. Hold metformin for the cath.  6. Stop smoking immediately.   Indications, risks, and potential benefits has been discussed with the  patient and his wife.  They agreed to proceed.     Philip C. Daleen Squibb, MD, [facility]  Electronically Signed    TCW/MedQ  DD: 08/28/2007  DT: 08/29/2007  Job #: [id number]   cc:   Philip Borer, MD

## 2010-07-14 NOTE — Assessment & Plan Note (Signed)
[facility]                            CARDIOLOGY OFFICE NOTE   NAME:Philip Gray, [name]                          MRN:          [id number]  DATE:09/18/2007                            DOB:          [birth year]    [name] returns after having a cardiac catheterization for exertional  dyspnea and probable coronary disease.  He had a totally occluded mid  circumflex with collateral filling of the distal circumflex branches.  He had nonobstructive disease.  The LAD in the right coronary artery.  He has an EF of 65% with normal systolic function.  There was no mitral  regurgitation.   Dr. Excell Seltzer and I discussed his catheterization and felt this was low-  risk coronary anatomy and we would proceed with medical therapy.  This  includes not smoking.   He is currently on Lipitor 80 mg q.h.s., Plavix 75 mg a day, and  metformin 500 mg a day.   Unfortunately, his chest x-ray showed a question of a left upper lobe  nodule.  CT scan was recommended.  He has a nonspecific 11-mm subpleural  nodule in the left upper lobe that potentially is neoplastic per the  radiologist report.  He needs pulmonary referral.   He also has peripheral vascular disease with left lower extremity  claudication.  His ABIs demonstrated ABI of 0.62 on the right, 0.55 on  the left.  His fem-fem bypass graft seemed  to be widely patent.  I have  arranged him to see Dr. Waverly Ferrari this week.   He still has his baseline dyspnea on exertion.   PHYSICAL EXAMINATION:  GENERAL:  He is very pleasant.  He is overwhelmed  with the information I have given him today, which is reasonable and  understandable.  VITAL SIGNS:  His blood pressure is 112/76, his pulse is 82 and regular,  his weight is 171.  HEENT:  Unchanged.  NECK:  Carotids are full without bruits.  Thyroid is not enlarged.  LUNGS:  Decreased breath sounds throughout but clear.  HEART:  Reveals a regular rate and rhythm.  No  gallop.  ABDOMEN:  Soft.  EXTREMITIES:  No edema.  Pulses are hard to appreciate.   I have had a nice chat with Mr. Cazarez today.  I have arranged for him to  see Dr. Waverly Ferrari for his leg and then also Dr. Kalman Shan of pulmonary division about his abnormal CT.   We will continue his current medical program.  I will plan on seeing him  back again in 3 months.     Thomas C. Daleen Squibb, MD, [facility]  Electronically Signed    TCW/MedQ  DD: 09/18/2007  DT: 09/19/2007  Job #: [id number]   cc:   Kalman Shan, MD  Di Kindle. Edilia Bo, M.D.

## 2010-07-14 NOTE — Cardiovascular Report (Signed)
NAME:  Philip Gray, Philip Gray                 ACCOUNT NO.:  [id number]   MEDICAL RECORD NO.:  [id number]          PATIENT TYPE:  OIB   LOCATION:  1966                         FACILITY:  [facility]   PHYSICIAN:  Veverly Fells. Excell Seltzer, MD  DATE OF BIRTH:  1933/09/07   DATE OF PROCEDURE:  08/30/2007  DATE OF DISCHARGE:                            CARDIAC CATHETERIZATION   PROCEDURES:  Left heart catheterization, selective coronary angiography,  and left ventricular angiography.   INDICATIONS:  Philip Gray is a 77 year old gentleman with peripheral  arterial disease.  He has undergone previous right to left fem-pop  bypass.  He has multiple cardiac risk factors and ongoing tobacco use.  He underwent Myoview stress test that suggested inferior ischemia and  when he was referred for cardiac catheterization.   Risks and indications of the procedure were reviewed with the patient.  Informed consent was obtained.  The right groin was prepped and draped,  anesthetized with 1% lidocaine using modified Seldinger technique.  Multiple attempts were made to access the right femoral artery above the  fem-pop bypass.  The bypass graft was entered on multiple attempts.  I  attempted to stick above the bypass, but was unable to access the right  femoral artery.  At that point, the right brachial region was prepped  and draped.  Using a micropuncture kit, the right brachial artery was  accessed without difficulty.  A 4-French JL-4 and 5-French 3DRC catheter  were used for coronary angiography.  An angled pigtail catheter was used  for left ventriculography.  All catheter exchanges were performed over a  long 0.035 J-wire.  The patient tolerated the procedure well and had no  immediate complications.  A 1000 units of unfractionated heparin was  given through the brachial sheath at the time of access.   FINDINGS:  Left ventricular pressure 143/28, aortic pressure 128/65 with  a mean of 90.   Coronary angiography:  Left  mainstem is widely patent with no  significant stenosis.  The left main bifurcates into the LAD and left  circumflex.   LAD:  The LAD is a large-caliber vessel that courses down and wraps  around the LV apex.  The proximal LAD has moderate calcification.  The  vessel is widely patent.  At the first septal perforator, there is a 30%  stenosis just proximal to the perforator.  There are mild luminal  irregularities throughout the remaining portions of the LAD, but no  significant stenoses are identified.  There are four diagonal branches,  all of which are of small to medium caliber.  There are no significant  stenoses in the diagonal branches.   Left circumflex:  The left circumflex supplies a moderate-sized first OM  branch that is widely patent.  Just beyond the OM branch the left  circumflex is occluded and at second OM and distal AV groove circumflex  fill in from left to the left collaterals.  A portion of the distal AV  groove circumflex also fills in from collaterals from the right coronary  artery.  The area of occlusion is long.   Right  coronary artery:  The right coronary artery has a high anterior  origin.  The vessel was small to moderate-sized.  There is mild diffuse  plaque noted.  There is a 30% stenosis in the proximal portion of the  vessel.  Distally, there is a PDA branch and a small posterolateral  branch that have no significant stenoses.   Left ventriculography demonstrates normal LV systolic function with an  LVEF of 65%.  There is no significant mitral regurgitation.   ASSESSMENT:  1. Single-vessel coronary artery disease with total occlusion of the      mid left circumflex and collateral filling of the distal circumflex      branches.  2. Minimal nonobstructive disease of the left anterior descending      artery and right coronary artery.  3. Preserved left ventricular systolic function.  4. Mild transaortic valve gradient.   RECOMMENDATIONS:  Recommend  medical therapy for Philip Gray CAD.  His  left circumflex is not approachable percutaneously and overall he has  fairly low-risk coronary anatomy and should respond well to medical  therapy.      Veverly Fells. Excell Seltzer, MD  Electronically Signed     MDC/MEDQ  D:  08/30/2007  T:  08/31/2007  Job:  [id number]   cc:   Lysle Morales, M.D.

## 2010-07-14 NOTE — Assessment & Plan Note (Signed)
OFFICE VISIT   ABISAI, DEER  DOB:  Jan 18, 1934                                       05/21/2008  YHCWC#:[id number]   I saw the patient in the office today for preop evaluation prior to  potential left total knee replacement.  This is a pleasant 77 year old  gentleman with end-stage degenerative joint disease of his left knee who  is being considered for knee replacement.  He was sent for vascular  preop evaluation.  I had previously seen him in the past in July of 2009  with some mild stable claudication.  We did not recommend any further  workup as his symptoms were quite tolerable and he did not have limb  threatening ischemia.  At that time a Doppler study from the [facility]  office in June of 2009 showed an ABI of 62% on the right and 55% on the  left.   Currently he experiences pain in the left knee which I think limits his  activity significantly.  I do not get any clear-cut history of  claudication, rest pain or history of nonhealing ulcers.   PAST MEDICAL HISTORY:  His past medical history is significant for non-  insulin-dependent diabetes and hypercholesterolemia.  He denies any  history of hypertension, history of previous myocardial infarction or  history of congestive heart failure.   FAMILY HISTORY:  There is no history of premature cardiovascular  disease.   SOCIAL HISTORY:  He smokes half a pack to one pack per day of  cigarettes.   REVIEW OF SYSTEMS:  He has had no recent chest pain, chest pressure,  palpitations or arrhythmias.  He has had no productive cough,  bronchitis, asthma or wheezing.   PHYSICAL EXAMINATION:  General:  This is a pleasant 77 year old  gentleman who appears his stated age.  Vital signs:  Blood pressure  143/83, heart rate is 93.  Neck:  Neck is supple.  There is no cervical  lymphadenopathy.  I do not detect any carotid bruits.  Lungs:  Clear  bilaterally to auscultation.  Cardiac:  He has a regular rate and  rhythm.  Abdomen:  Soft and nontender.  He has palpable femoral pulses.  I cannot palpate popliteal or pedal pulses on either side.  He has  monophasic Doppler signals in both feet.  The signals in the left foot  are more dampened than the right.  He has no ischemic ulcers on his  feet.   Doppler study in our office today shows an ABI of 49% on the left which  is down from 55% a year ago.  ABI on the right is 60% which is  relatively stable compared to the study in June of 2009.  The toe  pressure on the right is 50 mmHg and the toe pressure on the left is 58  mmHg.  My clinical impression is that he most likely has adequate  circulation to heal a knee replacement on the left side.  I suspect that  he has more significant tibial occlusive disease than proximal  superficial femoral artery occlusive disease.  However, given that he  has marginal ABI I have recommended we proceed with CT angio to further  evaluate his infrainguinal disease.  He had a fem-fem bypass graft done  in the past in [place] and I think his fem-fem graft is  working well  as he has normal femoral pulses.  However, I think he has infrainguinal  disease below this which could be better assessed with CT angio.  I am  reluctant to proceed with arteriography as this would require  cannulating his graft.  Once I have seen his CT angio and I am  comfortable that it is safe to proceed with knee replacement I will let  Dr. Thurston Hole know and also call the patient at [phone].   Di Kindle. Edilia Bo, M.D.  Electronically Signed   CSD/MEDQ  D:  05/21/2008  T:  05/22/2008  Job:  [id number]   cc:   Molly Maduro A. Thurston Hole, M.D.

## 2010-11-27 LAB — GLUCOSE, CAPILLARY: Glucose-Capillary: 127 — ABNORMAL HIGH

## 2011-01-18 DIAGNOSIS — I739 Peripheral vascular disease, unspecified: Secondary | ICD-10-CM | POA: Insufficient documentation

## 2011-01-29 ENCOUNTER — Other Ambulatory Visit: Payer: Self-pay

## 2011-01-29 DIAGNOSIS — I739 Peripheral vascular disease, unspecified: Secondary | ICD-10-CM

## 2011-01-29 DIAGNOSIS — I711 Thoracic aortic aneurysm, ruptured: Secondary | ICD-10-CM

## 2011-01-29 DIAGNOSIS — I714 Abdominal aortic aneurysm, without rupture: Secondary | ICD-10-CM

## 2011-02-17 ENCOUNTER — Other Ambulatory Visit: Payer: Self-pay

## 2011-02-17 ENCOUNTER — Ambulatory Visit: Payer: Self-pay | Admitting: Vascular Surgery

## 2011-03-22 ENCOUNTER — Encounter: Payer: Self-pay | Admitting: Vascular Surgery

## 2011-03-23 ENCOUNTER — Encounter: Payer: Self-pay | Admitting: Vascular Surgery

## 2011-03-24 ENCOUNTER — Other Ambulatory Visit (INDEPENDENT_AMBULATORY_CARE_PROVIDER_SITE_OTHER): Payer: Medicare Other | Admitting: *Deleted

## 2011-03-24 ENCOUNTER — Ambulatory Visit (INDEPENDENT_AMBULATORY_CARE_PROVIDER_SITE_OTHER): Payer: Medicare Other | Admitting: Vascular Surgery

## 2011-03-24 ENCOUNTER — Encounter: Payer: Self-pay | Admitting: Vascular Surgery

## 2011-03-24 VITALS — BP 130/69 | HR 76 | Resp 16 | Ht 70.0 in | Wt 170.0 lb

## 2011-03-24 DIAGNOSIS — I7143 Infrarenal abdominal aortic aneurysm, without rupture: Secondary | ICD-10-CM | POA: Insufficient documentation

## 2011-03-24 DIAGNOSIS — I714 Abdominal aortic aneurysm, without rupture: Secondary | ICD-10-CM | POA: Insufficient documentation

## 2011-03-24 DIAGNOSIS — I739 Peripheral vascular disease, unspecified: Secondary | ICD-10-CM

## 2011-03-24 DIAGNOSIS — I70219 Atherosclerosis of native arteries of extremities with intermittent claudication, unspecified extremity: Secondary | ICD-10-CM

## 2011-03-24 NOTE — Progress Notes (Signed)
[facility] [place]  Patient name: Philip Gray MRN: [id number] DOB: Jan 23, 1934 Sex: male  REASON FOR VISIT: follow up of peripheral vascular disease. Also small abdominal aortic aneurysm.  HPI: Philip Gray is a 78 y.o. male who I seen in consultation in the past with peripheral vascular disease. He was scheduled to undergo knee replacement and we gave in the okay for her that from a vascular standpoint. His had his knee replacement and has done well from that standpoint. He has a known left common iliac artery occlusion with patent fem-fem bypass graft which I believe was done elsewhere. He does admit to stable claudication in both calves which occurs approximately 2 blocks. His symptoms are brought on by ambulation and relieved with rest. He's had no rest pain no history nonhealing ulcers. His symptoms remained stable over the last year.  He also had an ultrasound today which shows a small 3 cm infrarenal abdominal aortic aneurysm. He has had no abdominal pain or back pain.  Past Medical History  Diagnosis Date  . Peripheral vascular disease   . Diabetes mellitus   . Hyperlipidemia   . Substance abuse     Tobacco abuse    Family History  Problem Relation Age of Onset  . Cancer Sister   . Diabetes Brother     SOCIAL HISTORY: History  Substance Use Topics  . Smoking status: Current Everyday Smoker -- 1.0 packs/day for 45 years  . Smokeless tobacco: Not on file  . Alcohol Use: No    No Known Allergies  Current Outpatient Prescriptions  Medication Sig Dispense Refill  . aspirin 81 MG tablet Take 160 mg by mouth daily.      Marland Kitchen atorvastatin (LIPITOR) 80 MG tablet Take 80 mg by mouth daily.      . clopidogrel (PLAVIX) 75 MG tablet Take 75 mg by mouth daily.      . metFORMIN (GLUCOPHAGE) 500 MG tablet Take 500 mg by mouth 2 (two) times daily with a meal.        REVIEW OF SYSTEMS: Arly.Keller ] denotes positive finding; [  ] denotes negative finding  CARDIOVASCULAR:  [  ] chest pain   [ ]  chest pressure   [ ]  palpitations   [ ]  orthopnea   Arly.Keller ] dyspnea on exertion   Arly.Keller ] claudication   [ ]  rest pain   [ ]  DVT   [ ]  phlebitis PULMONARY:   [ ]  productive cough   [ ]  asthma   [ ]  wheezing NEUROLOGIC:   [ ]  weakness  [ ]  paresthesias  [ ]  aphasia  [ ]  amaurosis  [ ]  dizziness HEMATOLOGIC:   [ ]  bleeding problems   [ ]  clotting disorders MUSCULOSKELETAL:  [ ]  joint pain   [ ]  joint swelling [ ]  leg swelling GASTROINTESTINAL: [ ]   blood in stool  [ ]   hematemesis GENITOURINARY:  [ ]   dysuria  [ ]   hematuria PSYCHIATRIC:  [ ]  history of major depression INTEGUMENTARY:  [ ]  rashes  [ ]  ulcers CONSTITUTIONAL:  [ ]  fever   [ ]  chills  PHYSICAL EXAM: Filed Vitals:   03/24/11 1004  BP: 130/69  Pulse: 76  Resp: 16  Height: 5\' 10"  (1.778 m)  Weight: 170 lb (77.111 kg)  SpO2: 98%   Body mass index is 24.39 kg/(m^2). GENERAL: The patient is a well-nourished male, in no acute distress. The vital signs are documented above. CARDIOVASCULAR: There is a regular rate  and rhythm without significant murmur appreciated. I do not detect carotid bruits. He has palpable femoral pulses. I cannot palpate dorsalis pedis or posterior tibial pulses bilaterally. He has no significant lower extremity swelling. PULMONARY: There is good air exchange bilaterally without wheezing or rales. ABDOMEN: Soft and non-tender with normal pitched bowel sounds. I cannot palpate his abdominal aortic aneurysm. MUSCULOSKELETAL: There are no major deformities or cyanosis. NEUROLOGIC: No focal weakness or paresthesias are detected. SKIN: There are no ulcers or rashes noted. PSYCHIATRIC: The patient has a normal affect.  DATA:  I have independently interpreted his arterial Doppler study which shows an ABI of 68% on the right and 47% on the left. He has monophasic Doppler signals in the dorsalis pedis and posterior tibial positions bilaterally.  I have independently interpreted the duplex of his aorta  which shows the maximum diameter of his distal aorta is 3.0 cm. This barely qualifies as an aneurysm but we'll need to continue to follow this.  MEDICAL ISSUES: Atherosclerosis of native arteries of the extremities with intermittent claudication Is patient has a known occlusion of the left common iliac artery. He has a patent fem-fem bypass graft. These ABIs today are reasonable at 68% on the right and 47% on the left. He has monophasic Doppler signals in both feet. He has mild claudication bilaterally which is been stable. He's had no rest pain and no nonhealing ulcers. Had a long discussion with him about the importance of tobacco cessation and he has been [facility]. Also encouraged him to stay as active as possible and try to get on a structured walking program. I've ordered follow up ABIs in 2 years. I'll see him back at that time. He knows to call sooner if he has problems.  Abdominal aneurysm without mention of rupture By ultrasound today this patient has a 3.0 cm infrarenal abdominal aortic aneurysm involving his distal aorta. This barely qualifies as an aneurysm. However I've ordered a follow up ultrasound in 2 years. Also discussed the importance of tobacco cessation with him and he has been referred to the tobacco cessation clinic at [place].      DICKSON,CHRISTOPHER S [facility] [place] Beeper: [phone]

## 2011-03-24 NOTE — Assessment & Plan Note (Signed)
By ultrasound today this patient has a 3.0 cm infrarenal abdominal aortic aneurysm involving his distal aorta. This barely qualifies as an aneurysm. However I've ordered a follow up ultrasound in 2 years. Also discussed the importance of tobacco cessation with him and he has been referred to the tobacco cessation clinic at [place].

## 2011-03-24 NOTE — Assessment & Plan Note (Signed)
Is patient has a known occlusion of the left common iliac artery. He has a patent fem-fem bypass graft. These ABIs today are reasonable at 68% on the right and 47% on the left. He has monophasic Doppler signals in both feet. He has mild claudication bilaterally which is been stable. He's had no rest pain and no nonhealing ulcers. Had a long discussion with him about the importance of tobacco cessation and he has been [facility]. Also encouraged him to stay as active as possible and try to get on a structured walking program. I've ordered follow up ABIs in 2 years. I'll see him back at that time. He knows to call sooner if he has problems.

## 2011-03-31 NOTE — Procedures (Unsigned)
DUPLEX ULTRASOUND OF ABDOMINAL AORTA  INDICATION:  Follow-up AAA.  HISTORY: Diabetes:  Yes. Cardiac: Hypertension:  No. Smoking:  Yes Connective Tissue Disorder: Family History:  No Previous Surgery:  Femoral-to-femoral bypass graft 2004, performed in [place].  DUPLEX EXAM:         AP (cm)                   TRANSVERSE (cm) Proximal             2.16 cm                   2.16 cm Mid                  2.10 cm                   2.11 cm Distal               3.00 cm                   3.02 cm Right Iliac          1.01 cm                   1.10 cm Left Iliac           Not visualized            Not visualized  PREVIOUS:  Date:  AP:  TRANSVERSE:  IMPRESSION:  Dilatation of the distal abdominal aorta measuring 3.00 x 3.02 maximum diameter.  ___________________________________________ Di Kindle. Edilia Bo, M.D.  EM/MEDQ  D:  03/25/2011  T:  03/25/2011  Job:  [id number]

## 2011-06-15 ENCOUNTER — Other Ambulatory Visit: Payer: Self-pay | Admitting: Vascular Surgery

## 2012-12-26 ENCOUNTER — Ambulatory Visit: Payer: Self-pay | Admitting: Podiatry

## 2013-01-02 ENCOUNTER — Encounter: Payer: Self-pay | Admitting: Podiatry

## 2013-01-02 ENCOUNTER — Ambulatory Visit (INDEPENDENT_AMBULATORY_CARE_PROVIDER_SITE_OTHER): Payer: Medicare Other | Admitting: Podiatry

## 2013-01-02 VITALS — BP 176/77 | HR 82 | Resp 12 | Ht 70.0 in | Wt 175.0 lb

## 2013-01-02 DIAGNOSIS — B351 Tinea unguium: Secondary | ICD-10-CM

## 2013-01-02 DIAGNOSIS — M79609 Pain in unspecified limb: Secondary | ICD-10-CM

## 2013-01-02 NOTE — Progress Notes (Signed)
  Subjective:    Patient ID: Philip Gray, male    DOB: [birth year], 79 y.o.   MRN: [id number]  HPI    Review of Systems  Constitutional: Negative.   HENT: Negative.   Eyes: Negative.   Respiratory: Negative.   Cardiovascular: Negative.   Gastrointestinal: Negative.   Endocrine: Negative.   Genitourinary: Negative.   Musculoskeletal: Negative.   Skin: Negative.   Allergic/Immunologic: Negative.   Neurological: Negative.   Hematological: Negative.   Psychiatric/Behavioral: Negative.        Objective:   Physical Exam        Assessment & Plan:

## 2013-01-02 NOTE — Progress Notes (Signed)
Philip Gray presents today for routine nail and callus debridement. He relates that the pain is so that I can hardly walk on.  Objective: Vital signs are stable he is alert and oriented x3. Pulses are palpable bilateral. Nails are long dystrophic clinically mycotic and painful on palpation and debridement. Calluses to the plantar aspect of the bilateral foot forefoot primarily painful on debridement.  Assessment: Pain in limb secondary to onychomycosis and calluses.  Plan: Debridement of all reactive hyperkeratosis and debridement of nails 1 through 5 bilateral covered service secondary to pain followup with him in 3 months.

## 2013-02-28 ENCOUNTER — Other Ambulatory Visit: Payer: Self-pay | Admitting: Vascular Surgery

## 2013-02-28 DIAGNOSIS — I739 Peripheral vascular disease, unspecified: Secondary | ICD-10-CM

## 2013-02-28 DIAGNOSIS — Z48812 Encounter for surgical aftercare following surgery on the circulatory system: Secondary | ICD-10-CM

## 2013-03-13 ENCOUNTER — Encounter: Payer: Self-pay | Admitting: Family

## 2013-03-14 ENCOUNTER — Ambulatory Visit: Payer: 59 | Admitting: Family

## 2013-03-14 ENCOUNTER — Encounter (HOSPITAL_COMMUNITY): Payer: 59

## 2013-03-14 ENCOUNTER — Other Ambulatory Visit (HOSPITAL_COMMUNITY): Payer: 59

## 2013-03-15 ENCOUNTER — Ambulatory Visit: Payer: Medicare Other | Admitting: Podiatry

## 2013-03-22 ENCOUNTER — Ambulatory Visit (INDEPENDENT_AMBULATORY_CARE_PROVIDER_SITE_OTHER): Payer: Medicare Other | Admitting: Podiatry

## 2013-03-22 ENCOUNTER — Encounter: Payer: Self-pay | Admitting: Podiatry

## 2013-03-22 VITALS — BP 127/71 | HR 82 | Resp 16

## 2013-03-22 DIAGNOSIS — M79609 Pain in unspecified limb: Secondary | ICD-10-CM

## 2013-03-22 DIAGNOSIS — B351 Tinea unguium: Secondary | ICD-10-CM

## 2013-03-22 NOTE — Progress Notes (Signed)
Toenails and calluses. I can hardly walk.  Objective: Vital signs are stable alert and oriented x3. Nails are thick yellow dystrophic clinically mycotic. Porokeratotic lesion sub-first sub-fifth bilateral.  Assessment: Pain in limb secondary to onychomycosis and porokeratotic lesions.  Plan: Debridement of nails 1 through 5 bilateral is cover service associated with pain debridement of reactive hyperkeratosis. Followup with him in 2-3 months.

## 2013-04-24 ENCOUNTER — Encounter: Payer: Self-pay | Admitting: Family

## 2013-04-25 ENCOUNTER — Ambulatory Visit (INDEPENDENT_AMBULATORY_CARE_PROVIDER_SITE_OTHER): Payer: Medicare Other | Admitting: Family

## 2013-04-25 ENCOUNTER — Ambulatory Visit (HOSPITAL_COMMUNITY)
Admission: RE | Admit: 2013-04-25 | Discharge: 2013-04-25 | Disposition: A | Discharge status: Home / Self Care | Payer: Medicare Other | Source: Ambulatory Visit | Attending: Family | Admitting: Family

## 2013-04-25 ENCOUNTER — Ambulatory Visit (INDEPENDENT_AMBULATORY_CARE_PROVIDER_SITE_OTHER)
Admission: RE | Admit: 2013-04-25 | Discharge: 2013-04-25 | Disposition: A | Discharge status: Home / Self Care | Payer: Medicare Other | Source: Ambulatory Visit | Attending: Vascular Surgery | Admitting: Vascular Surgery

## 2013-04-25 ENCOUNTER — Encounter: Payer: Self-pay | Admitting: Family

## 2013-04-25 VITALS — BP 131/74 | HR 74 | Resp 16 | Ht 69.0 in | Wt 165.2 lb

## 2013-04-25 DIAGNOSIS — I714 Abdominal aortic aneurysm, without rupture, unspecified: Secondary | ICD-10-CM | POA: Insufficient documentation

## 2013-04-25 DIAGNOSIS — I739 Peripheral vascular disease, unspecified: Secondary | ICD-10-CM

## 2013-04-25 DIAGNOSIS — Z48812 Encounter for surgical aftercare following surgery on the circulatory system: Secondary | ICD-10-CM

## 2013-04-25 DIAGNOSIS — I70219 Atherosclerosis of native arteries of extremities with intermittent claudication, unspecified extremity: Secondary | ICD-10-CM

## 2013-04-25 NOTE — Patient Instructions (Addendum)
Peripheral Vascular Disease Peripheral Vascular Disease (PVD), also called Peripheral Arterial Disease (PAD), is a circulation problem caused by cholesterol (atherosclerotic plaque) deposits in the arteries. PVD commonly occurs in the lower extremities (legs) but it can occur in other areas of the body, such as your arms. The cholesterol buildup in the arteries reduces blood flow which can cause pain and other serious problems. The presence of PVD can place a person at risk for Coronary Artery Disease (CAD).  CAUSES  Causes of PVD can be many. It is usually associated with more than one risk factor such as:   High Cholesterol.  Smoking.  Diabetes.  Lack of exercise or inactivity.  High blood pressure (hypertension).  Obesity.  Family history. SYMPTOMS   When the lower extremities are affected, patients with PVD may experience:  Leg pain with exertion or physical activity. This is called INTERMITTENT CLAUDICATION. This may present as cramping or numbness with physical activity. The location of the pain is associated with the level of blockage. For example, blockage at the abdominal level (distal abdominal aorta) may result in buttock or hip pain. Lower leg arterial blockage may result in calf pain.  As PVD becomes more severe, pain can develop with less physical activity.  In people with severe PVD, leg pain may occur at rest.  Other PVD signs and symptoms:  Leg numbness or weakness.  Coldness in the affected leg or foot, especially when compared to the other leg.  A change in leg color.  Patients with significant PVD are more prone to ulcers or sores on toes, feet or legs. These may take longer to heal or may reoccur. The ulcers or sores can become infected.  If signs and symptoms of PVD are ignored, gangrene may occur. This can result in the loss of toes or loss of an entire limb.  Not all leg pain is related to PVD. Other medical conditions can cause leg pain such  as:  Blood clots (embolism) or Deep Vein Thrombosis.  Inflammation of the blood vessels (vasculitis).  Spinal stenosis. DIAGNOSIS  Diagnosis of PVD can involve several different types of tests. These can include:  Pulse Volume Recording Method (PVR). This test is simple, painless and does not involve the use of X-rays. PVR involves measuring and comparing the blood pressure in the arms and legs. An ABI (Ankle-Brachial Index) is calculated. The normal ratio of blood pressures is 1. As this number becomes smaller, it indicates more severe disease.  < 0.95  indicates significant narrowing in one or more leg vessels.  <0.8 there will usually be pain in the foot, leg or buttock with exercise.  <0.4 will usually have pain in the legs at rest.  <0.25  usually indicates limb threatening PVD.  Doppler detection of pulses in the legs. This test is painless and checks to see if you have a pulses in your legs/feet.  A dye or contrast material (a substance that highlights the blood vessels so they show up on x-ray) may be given to help your caregiver better see the arteries for the following tests. The dye is eliminated from your body by the kidney's. Your caregiver may order blood work to check your kidney function and other laboratory values before the following tests are performed:  Magnetic Resonance Angiography (MRA). An MRA is a picture study of the blood vessels and arteries. The MRA machine uses a large magnet to produce images of the blood vessels.  Computed Tomography Angiography (CTA). A CTA is a   specialized x-ray that looks at how the blood flows in your blood vessels. An IV may be inserted into your arm so contrast dye can be injected.  Angiogram. Is a procedure that uses x-rays to look at your blood vessels. This procedure is minimally invasive, meaning a small incision (cut) is made in your groin. A small tube (catheter) is then inserted into the artery of your groin. The catheter is  guided to the blood vessel or artery your caregiver wants to examine. Contrast dye is injected into the catheter. X-rays are then taken of the blood vessel or artery. After the images are obtained, the catheter is taken out. TREATMENT  Treatment of PVD involves many interventions which may include:  Lifestyle changes:  Quitting smoking.  Exercise.  Following a low fat, low cholesterol diet.  Control of diabetes.  Foot care is very important to the PVD patient. Good foot care can help prevent infection.  Medication:  Cholesterol-lowering medicine.  Blood pressure medicine.  Anti-platelet drugs.  Certain medicines may reduce symptoms of Intermittent Claudication.  Interventional/Surgical options:  Angioplasty. An Angioplasty is a procedure that inflates a balloon in the blocked artery. This opens the blocked artery to improve blood flow.  Stent Implant. A wire mesh tube (stent) is placed in the artery. The stent expands and stays in place, allowing the artery to remain open.  Peripheral Bypass Surgery. This is a surgical procedure that reroutes the blood around a blocked artery to help improve blood flow. This type of procedure may be performed if Angioplasty or stent implants are not an option. SEEK IMMEDIATE MEDICAL CARE IF:   You develop pain or numbness in your arms or legs.  Your arm or leg turns cold, becomes blue in color.  You develop redness, warmth, swelling and pain in your arms or legs. MAKE SURE YOU:   Understand these instructions.  Will watch your condition.  Will get help right away if you are not doing well or get worse. Document Released: 03/25/2004 Document Revised: 05/10/2011 Document Reviewed: 02/20/2008 ExitCare Patient Information 2014 [employer].   Smoking Cessation Quitting smoking is important to your health and has many advantages. However, it is not always easy to quit since nicotine is a very addictive drug. Often times, people try 3  times or more before being able to quit. This document explains the best ways for you to prepare to quit smoking. Quitting takes hard work and a lot of effort, but you can do it. ADVANTAGES OF QUITTING SMOKING  You will live longer, feel better, and live better.  Your body will feel the impact of quitting smoking almost immediately.  Within 20 minutes, blood pressure decreases. Your pulse returns to its normal level.  After 8 hours, carbon monoxide levels in the blood return to normal. Your oxygen level increases.  After 24 hours, the chance of having a heart attack starts to decrease. Your breath, hair, and body stop smelling like smoke.  After 48 hours, damaged nerve endings begin to recover. Your sense of taste and smell improve.  After 72 hours, the body is virtually free of nicotine. Your bronchial tubes relax and breathing becomes easier.  After 2 to 12 weeks, lungs can hold more air. Exercise becomes easier and circulation improves.  The risk of having a heart attack, stroke, cancer, or lung disease is greatly reduced.  After 1 year, the risk of coronary heart disease is cut in half.  After 5 years, the risk of stroke falls to   the same as a nonsmoker.  After 10 years, the risk of lung cancer is cut in half and the risk of other cancers decreases significantly.  After 15 years, the risk of coronary heart disease drops, usually to the level of a nonsmoker.  If you are pregnant, quitting smoking will improve your chances of having a healthy baby.  The people you live with, especially any children, will be healthier.  You will have extra money to spend on things other than cigarettes. QUESTIONS TO THINK ABOUT BEFORE ATTEMPTING TO QUIT You may want to talk about your answers with your caregiver.  Why do you want to quit?  If you tried to quit in the past, what helped and what did not?  What will be the most difficult situations for you after you quit? How will you plan to  handle them?  Who can help you through the tough times? Your family? Friends? A caregiver?  What pleasures do you get from smoking? What ways can you still get pleasure if you quit? Here are some questions to ask your caregiver:  How can you help me to be successful at quitting?  What medicine do you think would be best for me and how should I take it?  What should I do if I need more help?  What is smoking withdrawal like? How can I get information on withdrawal? GET READY  Set a quit date.  Change your environment by getting rid of all cigarettes, ashtrays, matches, and lighters in your home, car, or work. Do not let people smoke in your home.  Review your past attempts to quit. Think about what worked and what did not. GET SUPPORT AND ENCOURAGEMENT You have a better chance of being successful if you have help. You can get support in many ways.  Tell your family, friends, and co-workers that you are going to quit and need their support. Ask them not to smoke around you.  Get individual, group, or telephone counseling and support. Programs are available at local hospitals and health centers. Call your local health department for information about programs in your area.  Spiritual beliefs and practices may help some smokers quit.  Download a "quit meter" on your computer to keep track of quit statistics, such as how long you have gone without smoking, cigarettes not smoked, and money saved.  Get a self-help book about quitting smoking and staying off of tobacco. LEARN NEW SKILLS AND BEHAVIORS  Distract yourself from urges to smoke. Talk to someone, go for a walk, or occupy your time with a task.  Change your normal routine. Take a different route to work. Drink tea instead of coffee. Eat breakfast in a different place.  Reduce your stress. Take a hot bath, exercise, or read a book.  Plan something enjoyable to do every day. Reward yourself for not smoking.  Explore  interactive web-based programs that specialize in helping you quit. GET MEDICINE AND USE IT CORRECTLY Medicines can help you stop smoking and decrease the urge to smoke. Combining medicine with the above behavioral methods and support can greatly increase your chances of successfully quitting smoking.  Nicotine replacement therapy helps deliver nicotine to your body without the negative effects and risks of smoking. Nicotine replacement therapy includes nicotine gum, lozenges, inhalers, nasal sprays, and skin patches. Some may be available over-the-counter and others require a prescription.  Antidepressant medicine helps people abstain from smoking, but how this works is unknown. This medicine is available by prescription.    Nicotinic receptor partial agonist medicine simulates the effect of nicotine in your brain. This medicine is available by prescription. Ask your caregiver for advice about which medicines to use and how to use them based on your health history. Your caregiver will tell you what side effects to look out for if you choose to be on a medicine or therapy. Carefully read the information on the package. Do not use any other product containing nicotine while using a nicotine replacement product.  RELAPSE OR DIFFICULT SITUATIONS Most relapses occur within the first 3 months after quitting. Do not be discouraged if you start smoking again. Remember, most people try several times before finally quitting. You may have symptoms of withdrawal because your body is used to nicotine. You may crave cigarettes, be irritable, feel very hungry, cough often, get headaches, or have difficulty concentrating. The withdrawal symptoms are only temporary. They are strongest when you first quit, but they will go away within 10 14 days. To reduce the chances of relapse, try to:  Avoid drinking alcohol. Drinking lowers your chances of successfully quitting.  Reduce the amount of caffeine you consume. Once you  quit smoking, the amount of caffeine in your body increases and can give you symptoms, such as a rapid heartbeat, sweating, and anxiety.  Avoid smokers because they can make you want to smoke.  Do not let weight gain distract you. Many smokers will gain weight when they quit, usually less than 10 pounds. Eat a healthy diet and stay active. You can always lose the weight gained after you quit.  Find ways to improve your mood other than smoking. FOR MORE INFORMATION  www.smokefree.gov  Document Released: 02/09/2001 Document Revised: 08/17/2011 Document Reviewed: 05/27/2011 [employer] Patient Information 2014 [employer].  

## 2013-04-25 NOTE — Progress Notes (Signed)
[facility] [place] HISTORY AND PHYSICAL -PAD  History of Present Illness Philip Gray is a 80 y.o. male patient of Dr. Scot Dock who has a known occlusion of the left common iliac artery and small AAA. He has a patent fem-fem bypass graft, done in [place] in 2004.  His left calf tightens after walking about 10-15 minutes, relieved by rest, no claudication symptoms elsewhere. He denies non-healing wounds. He denies history of stroke or TIA, denies hx of MI. The patient denies New Medical or Surgical History.  Pt Diabetic: Yes, states in control Pt smoker: smoker  (1 ppd x many yrs)  Pt meds include: Statin :Yes ASA: Yes Other anticoagulants/antiplatelets: no, Plavix stopped a couple of years ago by his PCP, he denies bleeding or bruising issues  Past Medical History  Diagnosis Date  . Peripheral vascular disease   . Diabetes mellitus   . Hyperlipidemia   . Substance abuse     Tobacco abuse    Social History History  Substance Use Topics  . Smoking status: Current Every Day Smoker -- 1.00 packs/day for 45 years    Types: Cigarettes  . Smokeless tobacco: Never Used  . Alcohol Use: No    Family History Family History  Problem Relation Age of Onset  . Cancer Sister   . Diabetes Brother   . Hyperlipidemia Mother   . Heart attack Father     Past Surgical History  Procedure Laterality Date  . Joint replacement  2010    Left knee replacement    No Known Allergies  Current Outpatient Prescriptions  Medication Sig Dispense Refill  . aspirin 81 MG tablet Take 160 mg by mouth daily.      Marland Kitchen atorvastatin (LIPITOR) 80 MG tablet Take 80 mg by mouth daily.      . metFORMIN (GLUCOPHAGE) 500 MG tablet Take 500 mg by mouth 2 (two) times daily with a meal.       No current facility-administered medications for this visit.    ROS: See HPI for pertinent positives and negatives.   Physical Examination  Filed Vitals:   04/25/13 1000  BP: 131/74  Pulse: 74   Resp: 16   Filed Weights   04/25/13 1000  Weight: 165 lb 3.2 oz (74.934 kg)   Body mass index is 24.38 kg/(m^2).   General: A&O x 3, WDWN. Gait: normal Eyes: PERRLA. Pulmonary: CTAB, without wheezes , rales or rhonchi. Cardiac: regular Rythm , without detected murmur.         Carotid Bruits Left Right   Negative Negative  Aorta is not palpable. Radial pulses: are 2+ palpable and =                           VASCULAR EXAM: Extremities without ischemic changes  without Gangrene; without open wounds.                                                                                                          LE Pulses LEFT RIGHT  FEMORAL     palpable        POPLITEAL  not palpable   not palpable       POSTERIOR TIBIAL  not palpable   not palpable        DORSALIS PEDIS      ANTERIOR TIBIAL not palpable   not palpable    Abdomen: soft, NT, no masses. Skin: no rashes, no ulcers noted. Musculoskeletal: no muscle wasting or atrophy.  Neurologic: A&O X 3; Appropriate Affect ; SENSATION: normal; MOTOR FUNCTION:  moving all extremities equally, motor strength 5/5 throughout. Speech is fluent/normal. CN 2-12 intact.    Non-Invasive Vascular Imaging: DATE: 04/25/2013 ABI: RIGHT 0.66, Waveforms: mono;  LEFT 0.60, Waveforms: mono Previous (03/24/2011) ABI's: Right: 0.68, Left: 0.47 DUPLEX SCAN :  ABDOMINAL AORTA DUPLEX EVALUATION    INDICATION: Follow up abdominal aortic aneurysm    PREVIOUS INTERVENTION(S): Femoral to femoral bypass graft in 2004 performed in [place]    DUPLEX EXAM: Aortic aneurysm duplex    LOCATION DIAMETER AP (cm) DIAMETER TRANSVERSE (cm) VELOCITIES (cm/sec)  Aorta Proximal 2.1 2.0 67  Aorta Mid 2.0 2.0 194  Aorta Distal 3.0 3.0 94  Right Common Iliac Artery 1.0 - 90  Left Common Iliac Artery 1.2 - 63    Previous max aortic diameter:  3.0 x 3.0 Date: 03/24/2011     ADDITIONAL FINDINGS: Gallstones noted    IMPRESSION: 3.0 x 3.0 distal  abdominal aortic aneurysm. Iliac arteries not well visualized due to bowel gas.    Compared to the previous exam:  No significant change    ASSESSMENT: Philip Gray is a 80 y.o. 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 He has a patent fem-fem bypass graft, done in [place] in 2004.  He has mild to moderate left calf claudication. ABI's indicate moderate bilateral LE arterial occlusive disease. There is an improvement in the left leg, was severe arterial occlusive disease. Unfortunately he continues to smoke and has DM, which he states is in control.  PLAN:  He was counseled re smoking cessation. Graduated walking program. I discussed in depth with the patient the nature of atherosclerosis, and emphasized the importance of maximal medical management including strict control of blood pressure, blood glucose, and lipid levels, obtaining regular exercise, and cessation of smoking.  The patient is aware that without maximal medical management the underlying atherosclerotic disease process will progress, limiting the benefit of any interventions.  Based on the patient's vascular studies and examination, pt will return to clinic in 6 months for ABI's and AAA Duplex in 2 years.  The patient was given information about PAD including signs, symptoms, treatment, what symptoms should prompt the patient to seek immediate medical care, and risk reduction measures to take.  Clemon Chambers, RN, MSN, FNP-C [facility] Phone: [phone]  Clinic MD: Scot Dock  04/25/2013 10:06 AM

## 2013-05-31 ENCOUNTER — Ambulatory Visit: Payer: Medicare Other | Admitting: Podiatry

## 2013-06-12 ENCOUNTER — Ambulatory Visit (INDEPENDENT_AMBULATORY_CARE_PROVIDER_SITE_OTHER): Payer: Medicare Other | Admitting: Podiatry

## 2013-06-12 ENCOUNTER — Encounter: Payer: Self-pay | Admitting: Podiatry

## 2013-06-12 VITALS — BP 116/65 | HR 84 | Resp 18

## 2013-06-12 DIAGNOSIS — M79609 Pain in unspecified limb: Secondary | ICD-10-CM

## 2013-06-12 DIAGNOSIS — B351 Tinea unguium: Secondary | ICD-10-CM

## 2013-06-12 DIAGNOSIS — E119 Type 2 diabetes mellitus without complications: Secondary | ICD-10-CM

## 2013-06-12 DIAGNOSIS — Q828 Other specified congenital malformations of skin: Secondary | ICD-10-CM

## 2013-06-13 NOTE — Progress Notes (Signed)
He presents today with a chief complaint of painful elongated toenails and painful calluses to the plantar aspect of the bilateral foot.  Objective: Vital signs are stable he is alert and oriented x3. Pulses are palpable bilateral. Nails are thick yellow dystrophic onychomycotic with reactive hyperkeratosis plantar aspect of the bilateral foot.  Assessment: Pain in limb secondary to onychomycosis and porokeratosis.  Plan: Debridement of all reactive hyperkeratosis and debridement of nails 1 through 5 bilateral is a covered service.

## 2013-09-13 ENCOUNTER — Ambulatory Visit (INDEPENDENT_AMBULATORY_CARE_PROVIDER_SITE_OTHER): Payer: Medicare Other | Admitting: Podiatry

## 2013-09-13 DIAGNOSIS — E119 Type 2 diabetes mellitus without complications: Secondary | ICD-10-CM

## 2013-09-13 DIAGNOSIS — Q828 Other specified congenital malformations of skin: Secondary | ICD-10-CM

## 2013-09-13 DIAGNOSIS — M79609 Pain in unspecified limb: Secondary | ICD-10-CM

## 2013-09-13 DIAGNOSIS — B351 Tinea unguium: Secondary | ICD-10-CM

## 2013-09-13 DIAGNOSIS — M79676 Pain in unspecified toe(s): Secondary | ICD-10-CM

## 2013-09-13 NOTE — Progress Notes (Signed)
He presents today chief complaint of painful elongated toenails and corns and calluses as well as porokeratosis bilateral.  Objective: Vital signs are stable he is alert and oriented x3. Pulses are palpable bilateral. Nails are thick yellow dystrophic onychomycotic and painful palpation. Porokeratotic lesions are painful palpation. And free ulcerative.  Assessment: Diabetes with a history of pain in limb secondary to onychomycosis and porokeratotic lesions.  Plan: Treatment of all reactive hyperkeratotic lesions and debridement of nails 1 through 5 bilateral.

## 2013-09-25 ENCOUNTER — Ambulatory Visit: Payer: Medicare Other | Admitting: Podiatry

## 2013-10-23 ENCOUNTER — Encounter: Payer: Self-pay | Admitting: Family

## 2013-10-24 ENCOUNTER — Ambulatory Visit: Payer: Medicare Other | Admitting: Family

## 2013-10-24 ENCOUNTER — Encounter (HOSPITAL_COMMUNITY): Payer: Medicare Other

## 2013-11-15 ENCOUNTER — Encounter: Payer: Self-pay | Admitting: Podiatry

## 2013-11-15 ENCOUNTER — Ambulatory Visit (INDEPENDENT_AMBULATORY_CARE_PROVIDER_SITE_OTHER): Payer: Medicare Other | Admitting: Podiatry

## 2013-11-15 DIAGNOSIS — M79676 Pain in unspecified toe(s): Secondary | ICD-10-CM

## 2013-11-15 DIAGNOSIS — Q828 Other specified congenital malformations of skin: Secondary | ICD-10-CM

## 2013-11-15 DIAGNOSIS — M79609 Pain in unspecified limb: Secondary | ICD-10-CM

## 2013-11-15 DIAGNOSIS — E119 Type 2 diabetes mellitus without complications: Secondary | ICD-10-CM

## 2013-11-15 DIAGNOSIS — B351 Tinea unguium: Secondary | ICD-10-CM

## 2013-11-15 NOTE — Progress Notes (Signed)
He presents today with a chief complaint of painful elongated toenails and porokeratosis plantar aspect of the bilateral foot. He states that his diabetes is under control.  Objective: Vital signs are stable he is alert and oriented x3 pulses are palpable bilateral. Porokeratosis plantar aspect of the right foot x2 left foot x1 forefoot only. Nails are thick yellow dystrophic onychomycotic and painful palpation.  Assessment: Non-insulin-dependent diabetes mellitus with a history porokeratosis and pain in limb secondary to onychomycosis.  Plan: Discussed etiology pathology conservative versus surgical therapies at this point performed a nail debridement 1 through 5 bilateral is a covered service secondary to pain also debrided his porokeratotic lesions.

## 2013-12-11 ENCOUNTER — Encounter: Payer: Self-pay | Admitting: Cardiology

## 2014-01-17 ENCOUNTER — Ambulatory Visit: Payer: Medicare Other | Admitting: Podiatry

## 2014-01-29 ENCOUNTER — Ambulatory Visit (INDEPENDENT_AMBULATORY_CARE_PROVIDER_SITE_OTHER): Payer: Medicare Other | Admitting: Podiatry

## 2014-01-29 ENCOUNTER — Encounter: Payer: Self-pay | Admitting: Podiatry

## 2014-01-29 VITALS — BP 145/77 | HR 86 | Resp 12

## 2014-01-29 DIAGNOSIS — B351 Tinea unguium: Secondary | ICD-10-CM

## 2014-01-29 DIAGNOSIS — M79673 Pain in unspecified foot: Secondary | ICD-10-CM

## 2014-01-29 NOTE — Progress Notes (Signed)
He presents today with chief complaint of painful elongated toenails.  Objective: Vital signs are stable he is alert and oriented 3. His nails are thick yellow dystrophic with mycotic and painful palpation.  Assessment: Pain limb saving onychomycosis 1 through 5 bilateral.  Plan: Treatment of nails 1 through 5 bilateral.   

## 2014-04-04 ENCOUNTER — Ambulatory Visit (INDEPENDENT_AMBULATORY_CARE_PROVIDER_SITE_OTHER): Payer: Medicare Other | Admitting: Podiatry

## 2014-04-04 DIAGNOSIS — M79673 Pain in unspecified foot: Secondary | ICD-10-CM

## 2014-04-04 DIAGNOSIS — B351 Tinea unguium: Secondary | ICD-10-CM

## 2014-04-04 NOTE — Progress Notes (Signed)
He presents today with chief complaint of painful elongated toenails.  Objective: Vital signs are stable he is alert and oriented 3. His nails are thick yellow dystrophic with mycotic and painful palpation.  Assessment: Pain limb saving onychomycosis 1 through 5 bilateral.  Plan: Treatment of nails 1 through 5 bilateral.

## 2014-06-06 ENCOUNTER — Ambulatory Visit (INDEPENDENT_AMBULATORY_CARE_PROVIDER_SITE_OTHER): Payer: Medicare Other | Admitting: Podiatry

## 2014-06-06 DIAGNOSIS — M79673 Pain in unspecified foot: Secondary | ICD-10-CM

## 2014-06-06 DIAGNOSIS — B351 Tinea unguium: Secondary | ICD-10-CM

## 2014-06-06 NOTE — Progress Notes (Signed)
He presents today with chief complaint of painful elongated toenails. And painful calluses.  Objective: Vital signs are stable he is alert and oriented 3. His nails are thick yellow dystrophic with mycotic and painful palpation. Porokeratosis plantar aspect of the bilateral foot.  Assessment: Pain limb saving onychomycosis 1 through 5 bilateral. Porokeratosis bilateral.  Plan: Treatment of nails 1 through 5 bilateral. Debridement of porokeratotic lesions superficial and deep. No iatrogenic lesions noted.

## 2014-09-05 ENCOUNTER — Ambulatory Visit (INDEPENDENT_AMBULATORY_CARE_PROVIDER_SITE_OTHER): Payer: Medicare Other | Admitting: Podiatry

## 2014-09-05 ENCOUNTER — Encounter: Payer: Self-pay | Admitting: Podiatry

## 2014-09-05 DIAGNOSIS — Q828 Other specified congenital malformations of skin: Secondary | ICD-10-CM

## 2014-09-05 DIAGNOSIS — M79673 Pain in unspecified foot: Secondary | ICD-10-CM

## 2014-09-05 DIAGNOSIS — B351 Tinea unguium: Secondary | ICD-10-CM

## 2014-09-05 NOTE — Progress Notes (Signed)
Patient presents to the office today with a chief complaint of painful elongated toenails and hyperkeratotic lesions.  Objective: Pulses are palpable bilateral. Nails are thick yellow dystrophic clinically mycotic and painful palpation.  Assessment: Pain in limb secondary to onychomycosis 1 through 5 bilateral and hyperkeratotic lesions.  Plan: Debridement of nails 1 through 5 bilateral covered service secondary to pain and debridement hyperkeratotic lesions.  

## 2014-11-12 ENCOUNTER — Ambulatory Visit (INDEPENDENT_AMBULATORY_CARE_PROVIDER_SITE_OTHER): Payer: Medicare Other | Admitting: Podiatry

## 2014-11-12 ENCOUNTER — Encounter: Payer: Self-pay | Admitting: Podiatry

## 2014-11-12 DIAGNOSIS — Q828 Other specified congenital malformations of skin: Secondary | ICD-10-CM | POA: Diagnosis not present

## 2014-11-12 DIAGNOSIS — B351 Tinea unguium: Secondary | ICD-10-CM

## 2014-11-12 DIAGNOSIS — M79673 Pain in unspecified foot: Secondary | ICD-10-CM

## 2014-11-12 NOTE — Progress Notes (Signed)
Patient ID: Philip Gray, male   DOB: 11/05/1933, 81 y.o.   MRN: [id number] HPI  Complaint:  Visit Type: Patient returns to my office for continued preventative foot care services. Complaint: Patient states" my nails have grown long and thick and become painful to walk and wear shoes" Patient has been diagnosed with DM . This patient  presents for preventative foot care services. No changes to ROS.  Patient also has callus on both forefeet. Which are painful when walking.  Podiatric Exam: Vascular: dorsalis pedis and posterior tibial pulses are -palpable. Capillary return is WNL.. Cold feet noted..  Sensorium: Normal Semmes Weinstein monofilament test. Normal tactile sensation bilaterally.  Nail Exam: Pt has thick disfigured discolored nails with subungual debris noted bilateral entire nail hallux through fifth toenails Ulcer Exam: There is no evidence of ulcer or pre-ulcerative changes or infection. Orthopedic Exam: Muscle tone and strength are WNL. No limitations in general ROM. No crepitus or effusions noted. Foot type and digits show no abnormalities. Bony prominences are unremarkable. Skin: No Porokeratosis. No infection or ulcers  Diagnosis:  Onychomycosis, Pain in right toe, pain in left toes  Treatment & Plan Procedures and Treatment: Consent by patient was obtained for treatment procedures. The patient understood the discussion of treatment and procedures well. All questions were answered thoroughly reviewed. Debridement of mycotic and hypertrophic toenails, 1 through 5 bilateral and clearing of subungual debris. No ulceration, no infection noted.  Return Visit-Office Procedure: Patient instructed to return to the office for a follow up visit 3 months for continued evaluation and treatment.

## 2015-01-16 DIAGNOSIS — F17218 Nicotine dependence, cigarettes, with other nicotine-induced disorders: Secondary | ICD-10-CM | POA: Insufficient documentation

## 2015-02-04 ENCOUNTER — Ambulatory Visit: Payer: Medicare Other | Admitting: Podiatry

## 2015-02-12 ENCOUNTER — Other Ambulatory Visit: Payer: Self-pay | Admitting: *Deleted

## 2015-02-12 DIAGNOSIS — I739 Peripheral vascular disease, unspecified: Secondary | ICD-10-CM

## 2015-02-14 ENCOUNTER — Encounter: Payer: Self-pay | Admitting: Family

## 2015-02-17 ENCOUNTER — Ambulatory Visit (INDEPENDENT_AMBULATORY_CARE_PROVIDER_SITE_OTHER): Payer: Medicare Other | Admitting: Podiatry

## 2015-02-17 DIAGNOSIS — M79676 Pain in unspecified toe(s): Secondary | ICD-10-CM

## 2015-02-17 DIAGNOSIS — Q828 Other specified congenital malformations of skin: Secondary | ICD-10-CM | POA: Diagnosis not present

## 2015-02-17 DIAGNOSIS — B351 Tinea unguium: Secondary | ICD-10-CM

## 2015-02-17 DIAGNOSIS — M79673 Pain in unspecified foot: Secondary | ICD-10-CM

## 2015-02-17 NOTE — Progress Notes (Signed)
  Subjective: 81 y.o. returns the office today for painful, elongated, thickened toenails and calluses which they cannot trim themself. Denies any redness or drainage around the nails. Denies any acute changes since last appointment and no new complaints today. Denies any systemic complaints such as fevers, chills, nausea, vomiting.   Objective: AAO 3, NAD DP/PT pulses palpable, CRT less than 3 seconds Protective sensation intact with Simms Weinstein monofilament.  Nails hypertrophic, dystrophic, elongated, brittle, discolored 10. There is tenderness overlying the nails 1-5 bilaterally. There is no surrounding erythema or drainage along the nail sites. Hyperkerotic lesion right submetatarsal 2 and 4 and left submetataral 1. Upon debridement, no underlying ulceration, drainage, or other signs of infection.  No open lesions or pre-ulcerative lesions are identified. No other areas of tenderness bilateral lower extremities. No overlying edema, erythema, increased warmth. No pain with calf compression, swelling, warmth, erythema.  Assessment: Patient presents with symptomatic onychomycosis; porokertosis   Plan: -Treatment options including alternatives, risks, complications were discussed -Nails sharply debrided 10 without complication/bleeding. -Hyperkerotic lesion debrided x 3 without complications/bleeding.  -Discussed daily foot inspection. If there are any changes, to call the office immediately.  -Follow-up in 3 months or sooner if any problems are to arise. In the meantime, encouraged to call the office with any questions, concerns, changes symptoms.  Celesta Gentile, DPM

## 2015-02-19 ENCOUNTER — Ambulatory Visit (INDEPENDENT_AMBULATORY_CARE_PROVIDER_SITE_OTHER): Payer: Medicare Other | Admitting: Family

## 2015-02-19 ENCOUNTER — Encounter: Payer: Self-pay | Admitting: Family

## 2015-02-19 ENCOUNTER — Ambulatory Visit (HOSPITAL_COMMUNITY)
Admission: RE | Admit: 2015-02-19 | Discharge: 2015-02-19 | Disposition: A | Discharge status: Home / Self Care | Payer: Medicare Other | Source: Ambulatory Visit | Attending: Family | Admitting: Family

## 2015-02-19 VITALS — BP 148/77 | HR 69 | Ht 69.0 in | Wt 161.0 lb

## 2015-02-19 DIAGNOSIS — I714 Abdominal aortic aneurysm, without rupture, unspecified: Secondary | ICD-10-CM

## 2015-02-19 DIAGNOSIS — F17200 Nicotine dependence, unspecified, uncomplicated: Secondary | ICD-10-CM

## 2015-02-19 DIAGNOSIS — I739 Peripheral vascular disease, unspecified: Secondary | ICD-10-CM | POA: Insufficient documentation

## 2015-02-19 DIAGNOSIS — Z720 Tobacco use: Secondary | ICD-10-CM | POA: Diagnosis not present

## 2015-02-19 NOTE — Patient Instructions (Signed)

## 2015-02-19 NOTE — Progress Notes (Signed)
[facility] [place] HISTORY AND PHYSICAL -PAD  History of Present Illness Philip Gray is a 81 y.o. male patient of Dr. Scot Dock who has a known occlusion of the left common iliac artery and small AAA. He has a fem-fem bypass graft, done in [place] in 2004.  His left calf tightens after walking about 10-15 minutes, unchanged from previous visit, relieved by rest, no claudication symptoms elsewhere. He denies non-healing wounds. He denies history of stroke or TIA, denies hx of MI. The patient denies New Medical or Surgical History.  Pt Diabetic: Yes, states in control Pt smoker: smoker (1 ppd x many yrs), stopped and started   Pt meds include: Statin :Yes ASA: Yes Other anticoagulants/antiplatelets: Plavix   Past Medical History  Diagnosis Date  . Peripheral vascular disease   . Diabetes mellitus   . Hyperlipidemia   . Substance abuse     Tobacco abuse    Social History Social History  Substance Use Topics  . Smoking status: Current Every Day Smoker -- 1.00 packs/day for 45 years    Types: Cigarettes  . Smokeless tobacco: Never Used  . Alcohol Use: No    Family History Family History  Problem Relation Age of Onset  . Cancer Sister   . Diabetes Brother   . Hyperlipidemia Mother   . Heart attack Father     Past Surgical History  Procedure Laterality Date  . Joint replacement  2010    Left knee replacement    No Known Allergies  Current Outpatient Prescriptions  Medication Sig Dispense Refill  . aspirin 81 MG tablet Take 160 mg by mouth daily.    Marland Kitchen atorvastatin (LIPITOR) 80 MG tablet Take 80 mg by mouth daily.    . clopidogrel (PLAVIX) 75 MG tablet Take 75 mg by mouth.    . metFORMIN (GLUCOPHAGE) 500 MG tablet Take 500 mg by mouth 2 (two) times daily with a meal.     No current facility-administered medications for this visit.    ROS: See HPI for pertinent positives and negatives.   Physical Examination  Filed Vitals:   02/19/15 1115  02/19/15 1117  BP: 153/84 148/77  Pulse: 69   Height: 5\' 9"  (1.753 m)   Weight: 161 lb (73.029 kg)   SpO2: 95%    Body mass index is 23.76 kg/(m^2).   General: WDWN male in NAD GAIT: normal Eyes: PERRLA Pulmonary: Non-labored, CTAB, Negative Rales, Negative rhonchi, & Negative wheezing.  Cardiac: regular Rhythm, Negative detected murmur.  VASCULAR EXAM Carotid Bruits Right Left   Negative Negative   Aorta is not palpable Femoral to femoral graft is not palpable but has clearly audible Doppler signals Radial pulses are 2+ palpable and equal.      LE Pulses Right Left       FEMORAL 3+ palpable 3+ palpable   POPLITEAL not palpable  not palpable   POSTERIOR TIBIAL  palpable   palpable    DORSALIS PEDIS  ANTERIOR TIBIAL not palpable  not palpable     Gastrointestinal: soft, nontender, BS WNL, no r/g, negative palpated masses.  Musculoskeletal: Negative muscle atrophy/wasting. M/S 5/5 throughout, Extremities without ischemic changes.  Neurologic: A&O X 3; Appropriate Affect, Speech is normal CN 2-12 intact, Pain and light touch intact in extremities, Motor exam as listed above.     Non-Invasive Vascular Imaging: DATE: 02/19/2015 ABI: RIGHT: 0.57 (0.66, 04/27/13), Waveforms: monophasic, TBI: 0.47;  LEFT: 0.51 (0.60), Waveforms: monophasic, TBI: 0.37   ASSESSMENT: Philip Gray is  a 81 y.o. 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 He has a patent fem-fem bypass graft, done in [place] in 2004.  He has mild to moderate left calf claudication. Femoral to femoral graft is not palpable but has clearly audible Doppler signals.  ABI's indicated moderate bilateral arterial occlusive disease.  His primary atherosclerotic risk factors include controlled DM and a ppd smoking;  see Plan.   His last visit before today was on 04/27/13; he was advised at that time to follow up in 6 months with ABI's and 2 years for AAA duplex.   PLAN:  The patient was counseled re smoking cessation and given several free resources re smoking cessation. Graduated walking program discussed.  Based on the patient's vascular studies and examination, pt will return to clinic in 2 months with fem-fem graft duplex, AAA duplex; will schedule ABI's after this.  I discussed in depth with the patient the nature of atherosclerosis, and emphasized the importance of maximal medical management including strict control of blood pressure, blood glucose, and lipid levels, obtaining regular exercise, and cessation of smoking.  The patient is aware that without maximal medical management the underlying atherosclerotic disease process will progress, limiting the benefit of any interventions.  The patient was given information about PAD including signs, symptoms, treatment, what symptoms should prompt the patient to seek immediate medical care, and risk reduction measures to take.  Clemon Chambers, RN, MSN, FNP-C [facility] Phone: [phone]  Clinic MD: Scot Dock  02/19/2015 10:45 AM

## 2015-04-15 ENCOUNTER — Encounter: Payer: Self-pay | Admitting: Family

## 2015-04-21 ENCOUNTER — Other Ambulatory Visit (HOSPITAL_COMMUNITY): Payer: Medicare Other

## 2015-04-21 ENCOUNTER — Ambulatory Visit: Payer: Medicare Other | Admitting: Family

## 2015-04-21 ENCOUNTER — Encounter (HOSPITAL_COMMUNITY): Payer: Medicare Other

## 2015-04-23 ENCOUNTER — Other Ambulatory Visit (HOSPITAL_COMMUNITY): Payer: Medicare Other

## 2015-04-23 ENCOUNTER — Ambulatory Visit: Payer: Medicare Other | Admitting: Family

## 2015-04-23 ENCOUNTER — Encounter (HOSPITAL_COMMUNITY): Payer: Medicare Other

## 2015-04-25 ENCOUNTER — Ambulatory Visit (INDEPENDENT_AMBULATORY_CARE_PROVIDER_SITE_OTHER): Payer: Medicare Other | Admitting: Podiatry

## 2015-04-25 ENCOUNTER — Encounter: Payer: Self-pay | Admitting: Podiatry

## 2015-04-25 DIAGNOSIS — M79673 Pain in unspecified foot: Secondary | ICD-10-CM | POA: Diagnosis not present

## 2015-04-25 DIAGNOSIS — Q828 Other specified congenital malformations of skin: Secondary | ICD-10-CM | POA: Diagnosis not present

## 2015-04-25 DIAGNOSIS — B351 Tinea unguium: Secondary | ICD-10-CM

## 2015-04-25 NOTE — Progress Notes (Signed)
Patient ID: Philip Gray, male   DOB: [birth year], 82 y.o.   MRN: [id number]  Subjective: 82 y.o. returns the office today for painful, elongated, thickened toenails and calluses which they cannot trim themself. Denies any redness or drainage around the nails.He also has painful calluses to the bottoms of his feet. No redness or drainage. No swelling or warmth to his feet. Denies any acute changes since last appointment and no new complaints today. Denies any systemic complaints such as fevers, chills, nausea, vomiting.   Objective: AAO 3, NAD DP/PT pulses palpable, CRT less than 3 seconds Protective sensation intact with Simms Weinstein monofilament.  Nails hypertrophic, dystrophic, elongated, brittle, discolored 10. There is tenderness overlying the nails 1-5 bilaterally. There is no surrounding erythema or drainage along the nail sites. Hyperkerotic lesion right submetatarsal 2 and 4 and left submetataral 1. Upon debridement, no underlying ulceration, drainage, or other signs of infection.  No open lesions or pre-ulcerative lesions are identified. No other areas of tenderness bilateral lower extremities. No overlying edema, erythema, increased warmth. No pain with calf compression, swelling, warmth, erythema.  Assessment: Patient presents with symptomatic onychomycosis; porokertosis   Plan: -Treatment options including alternatives, risks, complications were discussed -Nails sharply debrided 10 without complication/bleeding. -Hyperkerotic lesion debrided x 3 without complications/bleeding.  -Discussed daily foot inspection. If there are any changes, to call the office immediately.  -Follow-up in 3 months or sooner if any problems are to arise. In the meantime, encouraged to call the office with any questions, concerns, changes symptoms.  Celesta Gentile, DPM

## 2015-06-27 ENCOUNTER — Ambulatory Visit: Payer: Medicare Other | Admitting: Podiatry

## 2015-08-11 ENCOUNTER — Ambulatory Visit (INDEPENDENT_AMBULATORY_CARE_PROVIDER_SITE_OTHER): Payer: Medicare Other | Admitting: Podiatry

## 2015-08-11 ENCOUNTER — Encounter: Payer: Self-pay | Admitting: Podiatry

## 2015-08-11 VITALS — BP 150/82 | HR 76 | Resp 17

## 2015-08-11 DIAGNOSIS — B351 Tinea unguium: Secondary | ICD-10-CM

## 2015-08-11 DIAGNOSIS — M79673 Pain in unspecified foot: Secondary | ICD-10-CM | POA: Diagnosis not present

## 2015-08-11 DIAGNOSIS — Q828 Other specified congenital malformations of skin: Secondary | ICD-10-CM | POA: Diagnosis not present

## 2015-08-11 NOTE — Progress Notes (Signed)
Patient ID: Philip Gray, male   DOB: 1933-08-18, 82 y.o.   MRN: [id number]  Subjective: 82 y.o. returns the office today for painful, elongated, thickened toenails and calluses which they cannot trim themself. Denies any redness or drainage around the nails. Denies any acute changes since last appointment and no new complaints today. Denies any systemic complaints such as fevers, chills, nausea, vomiting.   Objective: AAO 3, NAD DP/PT pulses palpable, CRT less than 3 seconds Protective sensation intact with Simms Weinstein monofilament.  Nails hypertrophic, dystrophic, elongated, brittle, discolored 10. There is tenderness overlying the nails 1-5 bilaterally. There is no surrounding erythema or drainage along the nail sites. Hyperkerotic lesion right submetatarsal 2 and 4 and left submetataral 1. Upon debridement, no underlying ulceration, drainage, or other signs of infection.  No open lesions or pre-ulcerative lesions are identified. No other areas of tenderness bilateral lower extremities. No overlying edema, erythema, increased warmth. No pain with calf compression, swelling, warmth, erythema.  Assessment: Patient presents with symptomatic onychomycosis; porokertosis   Plan: -Treatment options including alternatives, risks, complications were discussed -Nails sharply debrided 80 without complication/bleeding. -Hyperkerotic lesion debrided x 3 without complications/bleeding.  -Discussed daily foot inspection. If there are any changes, to call the office immediately.  -Follow-up in 3 months or sooner if any problems are to arise. In the meantime, encouraged to call the office with any questions, concerns, changes symptoms.  Celesta Gentile, DPM

## 2015-11-17 ENCOUNTER — Encounter: Payer: Self-pay | Admitting: Podiatry

## 2015-11-17 ENCOUNTER — Ambulatory Visit (INDEPENDENT_AMBULATORY_CARE_PROVIDER_SITE_OTHER): Payer: Medicare Other | Admitting: Podiatry

## 2015-11-17 DIAGNOSIS — Q828 Other specified congenital malformations of skin: Secondary | ICD-10-CM | POA: Diagnosis not present

## 2015-11-17 DIAGNOSIS — B351 Tinea unguium: Secondary | ICD-10-CM | POA: Diagnosis not present

## 2015-11-17 DIAGNOSIS — M79673 Pain in unspecified foot: Secondary | ICD-10-CM

## 2015-11-24 NOTE — Progress Notes (Signed)
Patient ID: Philip Gray, male   DOB: 11-25-1933, 82 y.o.   MRN: [id number]  Subjective: 82 y.o. returns the office today for painful, elongated, thickened toenails and calluses which they cannot trim themself. Denies any redness or drainage around the nails. Denies any acute changes since last appointment and no new complaints today. Denies any systemic complaints such as fevers, chills, nausea, vomiting.   Objective: AAO 3, NAD DP/PT pulses palpable, CRT less than 3 seconds Protective sensation intact with Simms Weinstein monofilament.  Nails hypertrophic, dystrophic, elongated, brittle, discolored 10. There is tenderness overlying the nails 1-5 bilaterally. There is no surrounding erythema or drainage along the nail sites. Hyperkerotic lesion right submetatarsal 2 and 4 and left submetataral 1. Upon debridement, no underlying ulceration, drainage, or other signs of infection.  No open lesions or pre-ulcerative lesions are identified. No other areas of tenderness bilateral lower extremities. No overlying edema, erythema, increased warmth. No pain with calf compression, swelling, warmth, erythema.  Assessment: Patient presents with symptomatic onychomycosis; porokertosis   Plan: -Treatment options including alternatives, risks, complications were discussed -Nails sharply debrided 10 without complication/bleeding. -Hyperkerotic lesion debrided x 3 without complications/bleeding.  -Discussed daily foot inspection. If there are any changes, to call the office immediately.  -Follow-up in 3 months or sooner if any problems are to arise. In the meantime, encouraged to call the office with any questions, concerns, changes symptoms.  Celesta Gentile, DPM

## 2015-12-24 DIAGNOSIS — H269 Unspecified cataract: Secondary | ICD-10-CM | POA: Insufficient documentation

## 2015-12-24 DIAGNOSIS — H40009 Preglaucoma, unspecified, unspecified eye: Secondary | ICD-10-CM | POA: Insufficient documentation

## 2016-01-19 ENCOUNTER — Ambulatory Visit (INDEPENDENT_AMBULATORY_CARE_PROVIDER_SITE_OTHER): Payer: Medicare Other | Admitting: Podiatry

## 2016-01-19 ENCOUNTER — Encounter: Payer: Self-pay | Admitting: Podiatry

## 2016-01-19 DIAGNOSIS — M79676 Pain in unspecified toe(s): Secondary | ICD-10-CM | POA: Diagnosis not present

## 2016-01-19 DIAGNOSIS — Q828 Other specified congenital malformations of skin: Secondary | ICD-10-CM

## 2016-01-19 DIAGNOSIS — B351 Tinea unguium: Secondary | ICD-10-CM | POA: Diagnosis not present

## 2016-01-19 NOTE — Progress Notes (Signed)
Patient ID: Philip Gray, male   DOB: 1934-01-26, 82 y.o.   MRN: [id number]  Subjective: 82 y.o. returns the office today for painful, elongated, thickened toenails and calluses which they cannot trim themself. Denies any redness or drainage around the nails.  Also has painful calluses to his feet which continue to be symptomatic. Denies any acute changes since last appointment and no new complaints today. Denies any systemic complaints such as fevers, chills, nausea, vomiting.   Objective: AAO 3, NAD DP/PT pulses palpable, CRT less than 3 seconds Protective sensation intact with Simms Weinstein monofilament.  Nails hypertrophic, dystrophic, elongated, brittle, discolored 10. There is tenderness overlying the nails 1-5 bilaterally. There is no surrounding erythema or drainage along the nail sites. Hyperkerotic lesion right submetatarsal 2 and 4 and left submetataral 1. Upon debridement, no underlying ulceration, drainage, or other signs of infection.  No open lesions or pre-ulcerative lesions are identified. No other areas of tenderness bilateral lower extremities. No overlying edema, erythema, increased warmth. No pain with calf compression, swelling, warmth, erythema.  Assessment: Patient presents with symptomatic onychomycosis; porokertosis   Plan: -Treatment options including alternatives, risks, complications were discussed -Nails sharply debrided 10 without complication/bleeding. -Hyperkerotic lesion debrided x 3 without complications/bleeding.  -Discussed daily foot inspection. If there are any changes, to call the office immediately.  -Follow-up in 3 months or sooner if any problems are to arise. In the meantime, encouraged to call the office with any questions, concerns, changes symptoms.  Celesta Gentile, DPM

## 2016-03-26 ENCOUNTER — Ambulatory Visit (INDEPENDENT_AMBULATORY_CARE_PROVIDER_SITE_OTHER): Payer: Medicare Other | Admitting: Podiatry

## 2016-03-26 ENCOUNTER — Encounter: Payer: Self-pay | Admitting: Podiatry

## 2016-03-26 DIAGNOSIS — Q828 Other specified congenital malformations of skin: Secondary | ICD-10-CM

## 2016-03-26 DIAGNOSIS — M79676 Pain in unspecified toe(s): Secondary | ICD-10-CM | POA: Diagnosis not present

## 2016-03-26 DIAGNOSIS — I739 Peripheral vascular disease, unspecified: Secondary | ICD-10-CM | POA: Diagnosis not present

## 2016-03-26 DIAGNOSIS — B351 Tinea unguium: Secondary | ICD-10-CM

## 2016-03-31 NOTE — Progress Notes (Signed)
Patient ID: Philip Gray, male   DOB: [birth year], 83 y.o.   MRN: [id number]  Subjective: 83 y.o. returns the office today for painful, elongated, thickened toenails and calluses which they cannot trim themself. Denies any redness or drainage around the nails.  Also has painful calluses to his feet but denies any surrounding redness or drainage. Denies any acute changes since last appointment and no new complaints today. Denies any systemic complaints such as fevers, chills, nausea, vomiting.   Objective: AAO 3, NAD DP/PT pulses palpable 1/4 , CRT less than 3 seconds Protective sensation intact with Simms Weinstein monofilament.  Nails hypertrophic, dystrophic, elongated, brittle, discolored 10. There is tenderness overlying the nails 1-5 bilaterally. There is no surrounding erythema or drainage along the nail sites. Hyperkerotic lesion right submetatarsal 2 and 4 and left submetataral 1. Upon debridement, no underlying ulceration, drainage, or other signs of infection.  No open lesions or pre-ulcerative lesions are identified. No other areas of tenderness bilateral lower extremities. No overlying edema, erythema, increased warmth. No pain with calf compression, swelling, warmth, erythema.  Assessment: Patient presents with symptomatic onychomycosis; porokertosis   Plan: -Treatment options including alternatives, risks, complications were discussed -Nails sharply debrided 10 without complication/bleeding. -Hyperkerotic lesion debrided x 3 without complications/bleeding.  -Discussed daily foot inspection. If there are any changes, to call the office immediately.  -Follow-up in 3 months or sooner if any problems are to arise. In the meantime, encouraged to call the office with any questions, concerns, changes symptoms.  Celesta Gentile, DPM

## 2016-05-28 ENCOUNTER — Ambulatory Visit: Payer: Medicare Other | Admitting: Podiatry

## 2016-11-25 ENCOUNTER — Ambulatory Visit (INDEPENDENT_AMBULATORY_CARE_PROVIDER_SITE_OTHER): Payer: Medicare Other | Admitting: Podiatry

## 2016-11-25 ENCOUNTER — Encounter: Payer: Self-pay | Admitting: Podiatry

## 2016-11-25 DIAGNOSIS — Q828 Other specified congenital malformations of skin: Secondary | ICD-10-CM

## 2016-11-25 DIAGNOSIS — M79676 Pain in unspecified toe(s): Secondary | ICD-10-CM | POA: Diagnosis not present

## 2016-11-25 DIAGNOSIS — B351 Tinea unguium: Secondary | ICD-10-CM | POA: Diagnosis not present

## 2016-11-25 DIAGNOSIS — E1151 Type 2 diabetes mellitus with diabetic peripheral angiopathy without gangrene: Secondary | ICD-10-CM | POA: Diagnosis not present

## 2016-11-25 NOTE — Progress Notes (Signed)
Patient ID: Dontarius Sheley, male   DOB: 02/06/1934, 83 y.o.   MRN: [id number]  Subjective: 83 y.o. returns the office today for painful, elongated, thickened toenails and calluses which he cannot trim himself. Denies any redness or drainage around the nails. He also has painful calluses to his feet but denies any surrounding redness or drainage. Denies any acute changes since last appointment and no new complaints today. Denies any systemic complaints such as fevers, chills, nausea, vomiting.   Objective: AAO 3, NAD DP/PT pulses palpable 1/4 , CRT less than 3 seconds Protective sensation intact with Simms Weinstein monofilament.  Nails hypertrophic, dystrophic, elongated, brittle, discolored 10. There is tenderness overlying the nails 1-5 bilaterally. There is no surrounding erythema or drainage along the nail sites. Hyperkerotic lesion right submetatarsal 2 and 4 and left submetataral 1. Upon debridement, no underlying ulceration, drainage, or other signs of infection.  No open lesions or pre-ulcerative lesions are identified. No other areas of tenderness bilateral lower extremities. No overlying edema, erythema, increased warmth. No pain with calf compression, swelling, warmth, erythema.  Assessment: Patient presents with symptomatic onychomycosis; porokertosis   Plan: -Treatment options including alternatives, risks, complications were discussed -Nails sharply debrided 10 without complication/bleeding. -Hyperkerotic lesion debrided x 3 without complications -Due to pre-ulcerative calluses, diabetes and decreased pedal pulse I have recommended diabetic inserts. Paperwork was completed today for pre-certification.  -Discussed daily foot inspection. If there are any changes, to call the office immediately.  -Follow-up in 3 months or sooner if any problems are to arise. In the meantime, encouraged to call the office with any questions, concerns, changes symptoms.  Celesta Gentile, DPM

## 2017-01-17 ENCOUNTER — Ambulatory Visit: Payer: Medicare Other | Admitting: Orthotics

## 2017-01-17 DIAGNOSIS — M79676 Pain in unspecified toe(s): Secondary | ICD-10-CM

## 2017-01-17 DIAGNOSIS — I739 Peripheral vascular disease, unspecified: Secondary | ICD-10-CM

## 2017-01-17 DIAGNOSIS — Q828 Other specified congenital malformations of skin: Secondary | ICD-10-CM

## 2017-01-17 DIAGNOSIS — B351 Tinea unguium: Secondary | ICD-10-CM

## 2017-01-17 DIAGNOSIS — E1151 Type 2 diabetes mellitus with diabetic peripheral angiopathy without gangrene: Secondary | ICD-10-CM

## 2017-01-17 NOTE — Progress Notes (Signed)
Patient came in today for fitting and eval for diabetic shoes: Patient' doctor here is Designer, industrial/product  Patient presents with DM2, PN, Foot deformity, sub met callus preculcer Patient was measured with brannock 9.5 M device and cast in foam for custom inserts.  Patient chose [id number] Blk  PCP is tameka howell md

## 2017-01-27 ENCOUNTER — Ambulatory Visit: Payer: Medicare Other | Admitting: Podiatry

## 2017-01-27 DIAGNOSIS — B351 Tinea unguium: Secondary | ICD-10-CM | POA: Diagnosis not present

## 2017-01-27 DIAGNOSIS — Q828 Other specified congenital malformations of skin: Secondary | ICD-10-CM | POA: Diagnosis not present

## 2017-01-27 DIAGNOSIS — M79676 Pain in unspecified toe(s): Secondary | ICD-10-CM

## 2017-01-27 DIAGNOSIS — E1151 Type 2 diabetes mellitus with diabetic peripheral angiopathy without gangrene: Secondary | ICD-10-CM | POA: Diagnosis not present

## 2017-01-27 NOTE — Progress Notes (Signed)
Patient ID: Philip Gray, male   DOB: 07/15/1933, 83 y.o.   MRN: [id number]  Subjective: 83 y.o. returns the office today for painful, elongated, thickened toenails and calluses which he cannot trim himself. Denies any redness or drainage around the nails. He also has painful calluses to his feet but denies any surrounding redness or drainage. Denies any acute changes since last appointment and no new complaints today. Denies any systemic complaints such as fevers, chills, nausea, vomiting.   Objective: AAO 3, NAD DP/PT pulses palpable 1/4 , CRT less than 3 seconds Protective sensation intact with Simms Weinstein monofilament.  Nails hypertrophic, dystrophic, elongated, brittle, discolored 10. There is tenderness overlying the nails 1-5 bilaterally. There is no surrounding erythema or drainage along the nail sites. Hyperkerotic lesion right submetatarsal 2 and 4 and left submetataral 1. Upon debridement, no underlying ulceration, drainage, or other signs of infection.  No open lesions or pre-ulcerative lesions are identified. Flatfoot present.  No other areas of tenderness bilateral lower extremities. No overlying edema, erythema, increased warmth. No pain with calf compression, swelling, warmth, erythema.  Assessment: Patient presents with symptomatic onychomycosis; porokertosis   Plan: -Treatment options including alternatives, risks, complications were discussed -Nails sharply debrided 10 without complication/bleeding. -Hyperkerotic lesion debrided x 3 without complications -Awaiting diabetic shoes -Discussed daily foot inspection. If there are any changes, to call the office immediately.  -Follow-up in 3 months or sooner if any problems are to arise. In the meantime, encouraged to call the office with any questions, concerns, changes symptoms.  Celesta Gentile, DPM

## 2017-02-15 ENCOUNTER — Ambulatory Visit (INDEPENDENT_AMBULATORY_CARE_PROVIDER_SITE_OTHER): Payer: Medicare Other | Admitting: Orthotics

## 2017-02-15 DIAGNOSIS — I739 Peripheral vascular disease, unspecified: Secondary | ICD-10-CM | POA: Diagnosis not present

## 2017-02-15 DIAGNOSIS — E1151 Type 2 diabetes mellitus with diabetic peripheral angiopathy without gangrene: Secondary | ICD-10-CM | POA: Diagnosis not present

## 2017-02-15 NOTE — Progress Notes (Signed)
The patient could ambulate without any discomfort; there were no signs of any quality issues. The foot ortheses offered full contact with plantar surface and contoured the arch well.   The shoes fit well with no heel slippage and areas of pressure concern.   Patient advised to contact us if any problems arise.  Patient also advised on how to report any issues.

## 2017-04-28 ENCOUNTER — Ambulatory Visit: Payer: Medicare Other | Admitting: Podiatry

## 2017-12-31 ENCOUNTER — Encounter (HOSPITAL_COMMUNITY): Payer: Self-pay | Admitting: Emergency Medicine

## 2017-12-31 ENCOUNTER — Inpatient Hospital Stay (HOSPITAL_COMMUNITY)
Admission: EM | Admit: 2017-12-31 | Discharge: 2018-01-03 | DRG: 812 | Disposition: A | Discharge status: Home / Self Care | Payer: Medicare Other | Attending: Family Medicine | Admitting: Family Medicine

## 2017-12-31 ENCOUNTER — Emergency Department (HOSPITAL_COMMUNITY): Payer: Medicare Other

## 2017-12-31 ENCOUNTER — Inpatient Hospital Stay (HOSPITAL_COMMUNITY): Payer: Medicare Other

## 2017-12-31 ENCOUNTER — Other Ambulatory Visit: Payer: Self-pay

## 2017-12-31 DIAGNOSIS — E1122 Type 2 diabetes mellitus with diabetic chronic kidney disease: Secondary | ICD-10-CM | POA: Diagnosis present

## 2017-12-31 DIAGNOSIS — E785 Hyperlipidemia, unspecified: Secondary | ICD-10-CM | POA: Diagnosis present

## 2017-12-31 DIAGNOSIS — D123 Benign neoplasm of transverse colon: Secondary | ICD-10-CM | POA: Diagnosis present

## 2017-12-31 DIAGNOSIS — Z7982 Long term (current) use of aspirin: Secondary | ICD-10-CM | POA: Diagnosis not present

## 2017-12-31 DIAGNOSIS — D649 Anemia, unspecified: Secondary | ICD-10-CM | POA: Diagnosis not present

## 2017-12-31 DIAGNOSIS — M79671 Pain in right foot: Secondary | ICD-10-CM | POA: Diagnosis present

## 2017-12-31 DIAGNOSIS — I714 Abdominal aortic aneurysm, without rupture: Secondary | ICD-10-CM | POA: Diagnosis present

## 2017-12-31 DIAGNOSIS — I129 Hypertensive chronic kidney disease with stage 1 through stage 4 chronic kidney disease, or unspecified chronic kidney disease: Secondary | ICD-10-CM | POA: Diagnosis present

## 2017-12-31 DIAGNOSIS — Z96652 Presence of left artificial knee joint: Secondary | ICD-10-CM | POA: Diagnosis present

## 2017-12-31 DIAGNOSIS — R195 Other fecal abnormalities: Secondary | ICD-10-CM | POA: Diagnosis present

## 2017-12-31 DIAGNOSIS — H409 Unspecified glaucoma: Secondary | ICD-10-CM | POA: Diagnosis present

## 2017-12-31 DIAGNOSIS — Z7902 Long term (current) use of antithrombotics/antiplatelets: Secondary | ICD-10-CM | POA: Diagnosis not present

## 2017-12-31 DIAGNOSIS — E1151 Type 2 diabetes mellitus with diabetic peripheral angiopathy without gangrene: Secondary | ICD-10-CM | POA: Diagnosis present

## 2017-12-31 DIAGNOSIS — K648 Other hemorrhoids: Secondary | ICD-10-CM | POA: Diagnosis present

## 2017-12-31 DIAGNOSIS — N179 Acute kidney failure, unspecified: Secondary | ICD-10-CM | POA: Diagnosis not present

## 2017-12-31 DIAGNOSIS — N183 Chronic kidney disease, stage 3 (moderate): Secondary | ICD-10-CM | POA: Diagnosis present

## 2017-12-31 DIAGNOSIS — Z7984 Long term (current) use of oral hypoglycemic drugs: Secondary | ICD-10-CM

## 2017-12-31 DIAGNOSIS — M8580 Other specified disorders of bone density and structure, unspecified site: Secondary | ICD-10-CM | POA: Diagnosis present

## 2017-12-31 DIAGNOSIS — D509 Iron deficiency anemia, unspecified: Principal | ICD-10-CM | POA: Diagnosis present

## 2017-12-31 DIAGNOSIS — T465X5A Adverse effect of other antihypertensive drugs, initial encounter: Secondary | ICD-10-CM | POA: Diagnosis not present

## 2017-12-31 DIAGNOSIS — F17210 Nicotine dependence, cigarettes, uncomplicated: Secondary | ICD-10-CM | POA: Diagnosis present

## 2017-12-31 DIAGNOSIS — K259 Gastric ulcer, unspecified as acute or chronic, without hemorrhage or perforation: Secondary | ICD-10-CM | POA: Diagnosis present

## 2017-12-31 DIAGNOSIS — T380X5A Adverse effect of glucocorticoids and synthetic analogues, initial encounter: Secondary | ICD-10-CM | POA: Diagnosis not present

## 2017-12-31 DIAGNOSIS — K5730 Diverticulosis of large intestine without perforation or abscess without bleeding: Secondary | ICD-10-CM | POA: Diagnosis present

## 2017-12-31 DIAGNOSIS — R0602 Shortness of breath: Secondary | ICD-10-CM | POA: Diagnosis present

## 2017-12-31 DIAGNOSIS — J449 Chronic obstructive pulmonary disease, unspecified: Secondary | ICD-10-CM | POA: Diagnosis not present

## 2017-12-31 DIAGNOSIS — Z79899 Other long term (current) drug therapy: Secondary | ICD-10-CM

## 2017-12-31 DIAGNOSIS — J441 Chronic obstructive pulmonary disease with (acute) exacerbation: Secondary | ICD-10-CM

## 2017-12-31 DIAGNOSIS — M25571 Pain in right ankle and joints of right foot: Secondary | ICD-10-CM

## 2017-12-31 LAB — GLUCOSE, CAPILLARY
GLUCOSE-CAPILLARY: 82 mg/dL (ref 70–99)
Glucose-Capillary: 108 mg/dL — ABNORMAL HIGH (ref 70–99)

## 2017-12-31 LAB — POC OCCULT BLOOD, ED: FECAL OCCULT BLD: POSITIVE — AB

## 2017-12-31 LAB — CBC
HCT: 23.1 % — ABNORMAL LOW (ref 39.0–52.0)
HEMOGLOBIN: 5.9 g/dL — AB (ref 13.0–17.0)
MCH: 17.5 pg — AB (ref 26.0–34.0)
MCHC: 25.5 g/dL — ABNORMAL LOW (ref 30.0–36.0)
MCV: 68.3 fL — AB (ref 80.0–100.0)
NRBC: 0.3 % — AB (ref 0.0–0.2)
Platelets: 376 10*3/uL (ref 150–400)
RBC: 3.38 MIL/uL — AB (ref 4.22–5.81)
RDW: 18.3 % — ABNORMAL HIGH (ref 11.5–15.5)
WBC: 7.7 10*3/uL (ref 4.0–10.5)

## 2017-12-31 LAB — COMPREHENSIVE METABOLIC PANEL
ALK PHOS: 101 U/L (ref 38–126)
ALT: 39 U/L (ref 0–44)
AST: 34 U/L (ref 15–41)
Albumin: 3.4 g/dL — ABNORMAL LOW (ref 3.5–5.0)
Anion gap: 6 (ref 5–15)
BUN: 16 mg/dL (ref 8–23)
CALCIUM: 8.9 mg/dL (ref 8.9–10.3)
CHLORIDE: 106 mmol/L (ref 98–111)
CO2: 21 mmol/L — AB (ref 22–32)
CREATININE: 1.39 mg/dL — AB (ref 0.61–1.24)
GFR calc non Af Amer: 46 mL/min — ABNORMAL LOW (ref >60)
GFR, EST AFRICAN AMERICAN: 53 mL/min — AB (ref >60)
GLUCOSE: 165 mg/dL — AB (ref 70–99)
Potassium: 3.8 mmol/L (ref 3.5–5.1)
SODIUM: 133 mmol/L — AB (ref 135–145)
Total Bilirubin: 0.8 mg/dL (ref 0.3–1.2)
Total Protein: 7 g/dL (ref 6.5–8.1)

## 2017-12-31 LAB — I-STAT TROPONIN, ED: Troponin i, poc: 0 ng/mL (ref 0.00–0.08)

## 2017-12-31 LAB — PREPARE RBC (CROSSMATCH)

## 2017-12-31 MED ORDER — LOSARTAN POTASSIUM 50 MG PO TABS
100.0000 mg | ORAL_TABLET | Freq: Every day | ORAL | Status: DC
  Administered 2017-12-31 – 2018-01-01 (×2): 100 mg via ORAL
  Filled 2017-12-31 (×2): qty 2

## 2017-12-31 MED ORDER — ACETAMINOPHEN 325 MG PO TABS
650.0000 mg | ORAL_TABLET | Freq: Four times a day (QID) | ORAL | Status: DC | PRN
  Administered 2017-12-31: 650 mg via ORAL
  Filled 2017-12-31: qty 2

## 2017-12-31 MED ORDER — IPRATROPIUM-ALBUTEROL 0.5-2.5 (3) MG/3ML IN SOLN
3.0000 mL | Freq: Four times a day (QID) | RESPIRATORY_TRACT | Status: DC | PRN
  Filled 2017-12-31: qty 90

## 2017-12-31 MED ORDER — ACETAMINOPHEN 650 MG RE SUPP: 650.0000 mg | Freq: Four times a day (QID) | RECTAL | Status: DC | PRN

## 2017-12-31 MED ORDER — INSULIN ASPART 100 UNIT/ML ~~LOC~~ SOLN: 0.0000 [IU] | Freq: Three times a day (TID) | SUBCUTANEOUS | Status: DC

## 2017-12-31 MED ORDER — ATORVASTATIN CALCIUM 80 MG PO TABS
80.0000 mg | ORAL_TABLET | Freq: Every day | ORAL | Status: DC
  Administered 2017-12-31 – 2018-01-02 (×3): 80 mg via ORAL
  Filled 2017-12-31: qty 1
  Filled 2017-12-31 (×3): qty 4

## 2017-12-31 MED ORDER — METHYLPREDNISOLONE SODIUM SUCC 125 MG IJ SOLR: 125.0000 mg | Freq: Once | INTRAMUSCULAR | Status: DC

## 2017-12-31 MED ORDER — SODIUM CHLORIDE 0.9% IV SOLUTION: Freq: Once | INTRAVENOUS | Status: DC

## 2017-12-31 NOTE — ED Notes (Signed)
pts hgb is 5.9  Dr Regenia Skeeter notified acuity  increased

## 2017-12-31 NOTE — ED Provider Notes (Signed)
[facility] EMERGENCY DEPARTMENT Provider Note   CSN: [id number] Arrival date & time: 12/31/17  1357     History   Chief Complaint Chief Complaint  Patient presents with  . Shortness of Breath    HPI Philip Gray is a 84 y.o. male.  HPI Patient is an 84 year old male with a past medical history of diabetes, hyperlipidemia, peripheral vascular disease, back abuse, and COPD who presents the emergency department for evaluation of generalized malaise/fatigue for the past month as well as exertional shortness of breath.  Patient reports that he has had gradually worsening fatigue over the past 3 to 4 weeks.  States that anytime he gets up he feels much more tired than he normally does when completing any tasks.  In addition to his fatigue, patient also reports a worsening cough over the past few weeks it is been productive of clear sputum.  Initially he was coughing up sputum only a few times a day and now he is coughing up clear sputum a few times every hour.  He denies any fevers or chills is being associated with this cough.  He denies any shortness of breath at rest.  He does report that he has noticed several very dark stools over the past few weeks but denies any grossly bloody stools.  His wife is at bedside and also reports that he is appealed much more pale than his baseline.  His only other complaint at this time is of right ankle pain.  He denies any traumatic injury but does report it is mildly swollen compared to his left.  Remaining review of systems is as detailed below.  Past Medical History:  Diagnosis Date  . Diabetes mellitus   . Hyperlipidemia   . Peripheral vascular disease ([place])   . Substance abuse ([place])    Tobacco abuse    Patient Active Problem List   Diagnosis Date Noted  . Symptomatic anemia 12/31/2017  . Aftercare following surgery of the circulatory system, [place] 04/25/2013  . Atherosclerosis of native arteries of the extremities with  intermittent claudication 03/24/2011  . Abdominal aneurysm without mention of rupture 03/24/2011  . SMOKER 02/12/2009  . Chronic obstructive pulmonary disease ([place]) 02/12/2009  . HYPERLIPIDEMIA 09/29/2007  . PULMONARY NODULE 09/29/2007    Past Surgical History:  Procedure Laterality Date  . JOINT REPLACEMENT  2010   Left knee replacement        Home Medications    Prior to Admission medications   Medication Sig Start Date End Date Taking? Authorizing Provider  amLODipine (NORVASC) 2.5 MG tablet Take 2.5 mg by mouth at bedtime. 10/24/17  Yes [provider]  aspirin EC 81 MG tablet Take 81 mg by mouth daily.   Yes [provider]  atorvastatin (LIPITOR) 80 MG tablet Take 80 mg by mouth at bedtime.    Yes [provider]  clopidogrel (PLAVIX) 75 MG tablet Take 75 mg by mouth at bedtime.  11/13/13  Yes [provider]  losartan (COZAAR) 100 MG tablet Take 100 mg by mouth at bedtime.  02/13/15 07/01/18 Yes [provider]  metFORMIN (GLUCOPHAGE) 500 MG tablet Take 500 mg by mouth at bedtime.    Yes [provider]    Family History Family History  Problem Relation Age of Onset  . Cancer Sister   . Diabetes Brother   . Hyperlipidemia Mother   . Heart attack Father     Social History Social History   Tobacco Use  .  Smoking status: Current Every Day Smoker    Packs/day: 1.00    Years: 45.00    Pack years: 45.00    Types: Cigarettes  . Smokeless tobacco: Never Used  Substance Use Topics  . Alcohol use: No  . Drug use: No     Allergies   Patient has no known allergies.   Review of Systems Review of Systems  Constitutional: Positive for activity change and fatigue. Negative for chills and fever.  HENT: Negative for ear pain and sore throat.   Eyes: Negative for pain and visual disturbance.  Respiratory: Positive for shortness of breath. Negative for cough.   Cardiovascular: Negative for chest pain and  palpitations.  Gastrointestinal: Negative for abdominal pain and vomiting.       Reports dark stools.   Genitourinary: Negative for dysuria and hematuria.  Musculoskeletal: Negative for arthralgias and back pain.  Skin: Positive for pallor. Negative for rash.  Neurological: Negative for seizures and syncope.  All other systems reviewed and are negative.    Physical Exam Updated Vital Signs BP (!) 154/62 (BP Location: Left Arm)   Pulse 84   Temp 97.9 F (36.6 C) (Oral)   Resp 18   SpO2 98%   Physical Exam  Constitutional: He appears well-developed and well-nourished. No distress.  HENT:  Head: Normocephalic and atraumatic.  Eyes:  Conjunctiva pale.  Neck: Neck supple.  Cardiovascular: Normal rate and regular rhythm.  Pulmonary/Chest: Effort normal. No respiratory distress.  Lung sounds are mildly diminished in all fields.  No wheezing appreciated on exam.  No crackles heard.  Abdominal: Soft. There is no tenderness.  Genitourinary: Rectal exam shows guaiac positive stool.  Genitourinary Comments: Brown stool in the vault.  Musculoskeletal: He exhibits edema.  Mild edema on patient's right ankle.  He has full range of motion and no overlying erythema.  Normal capillary refill and sensation.  Neurological: He is alert.  Skin: Skin is warm and dry. There is pallor.  Psychiatric: He has a normal mood and affect.  Nursing note and vitals reviewed.    ED Treatments / Results  Labs (all labs ordered are listed, but only abnormal results are displayed) Labs Reviewed  CBC - Abnormal; Notable for the following components:      Result Value   RBC 3.38 (*)    Hemoglobin 5.9 (*)    HCT 23.1 (*)    MCV 68.3 (*)    MCH 17.5 (*)    MCHC 25.5 (*)    RDW 18.3 (*)    nRBC 0.3 (*)    All other components within normal limits  COMPREHENSIVE METABOLIC PANEL - Abnormal; Notable for the following components:   Sodium 133 (*)    CO2 21 (*)    Glucose, Bld 165 (*)    Creatinine, Ser  1.39 (*)    Albumin 3.4 (*)    GFR calc non Af Amer 46 (*)    GFR calc Af Amer 53 (*)    All other components within normal limits  BASIC METABOLIC PANEL - Abnormal; Notable for the following components:   CO2 20 (*)    Creatinine, Ser 1.35 (*)    Calcium 8.8 (*)    GFR calc non Af Amer 47 (*)    GFR calc Af Amer 55 (*)    All other components within normal limits  PROTIME-INR - Abnormal; Notable for the following components:   Prothrombin Time 15.3 (*)    All other components within normal limits  GLUCOSE, CAPILLARY - Abnormal; Notable for the following components:   Glucose-Capillary 108 (*)    All other components within normal limits  CBC - Abnormal; Notable for the following components:   RBC 4.02 (*)    Hemoglobin 8.1 (*)    HCT 27.8 (*)    MCV 69.2 (*)    MCH 20.1 (*)    MCHC 29.1 (*)    RDW 19.7 (*)    nRBC 0.3 (*)    All other components within normal limits  POC OCCULT BLOOD, ED - Abnormal; Notable for the following components:   Fecal Occult Bld POSITIVE (*)    All other components within normal limits  GLUCOSE, CAPILLARY  GLUCOSE, CAPILLARY  GLUCOSE, CAPILLARY  HEMOGLOBIN A1C  GLUCOSE, CAPILLARY  OCCULT BLOOD X 1 CARD TO LAB, STOOL  I-STAT TROPONIN, ED  TYPE AND SCREEN  PREPARE RBC (CROSSMATCH)    EKG None  Radiology Dg Chest 2 View  Result Date: 12/31/2017 CLINICAL DATA:  Pt presents to ED for assessment of SOB x 1 Month, but worsening in the past few days. Exertional SOB. Patient reports coughing up phlegm HX COPD, smokerCOPD, worsening SOB, cough EXAM: CHEST - 2 VIEW COMPARISON:  Radiograph 12/09/2009 FINDINGS: Normal cardiac silhouette. There is bilateral fine interstitial pattern which is increased from comparison exam 2011. LEFT upper lobe nodule measuring 10 mm is not changed from radiograph 2011. No infiltrate. No pneumothorax. No acute osseous abnormality. IMPRESSION: 1. Bilateral interstitial pattern is favored chronic interstitial lung disease.  Cannot exclude mild superimposed interstitial edema. 2. No change in LEFT upper lobe nodule over multiple years consistent benign etiology. Electronically Signed   By: Suzy Bouchard M.D.   On: 12/31/2017 15:07   Dg Ankle 2 Views Right  Result Date: 12/31/2017 CLINICAL DATA:  Right ankle and foot pain.  No trauma. EXAM: RIGHT FOOT COMPLETE - 3+ VIEW; RIGHT ANKLE - 2 VIEW COMPARISON:  None. FINDINGS: No acute fracture or dislocation. The ankle mortise is symmetric. The talar dome is intact. 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 Remaining joint spaces are preserved. Osteopenia. Soft tissues are unremarkable. IMPRESSION: 1. No acute osseous abnormality of the right ankle or foot. 2. 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 Electronically Signed   By: Titus Dubin M.D.   On: 12/31/2017 19:14   Dg Foot Complete Right  Result Date: 12/31/2017 CLINICAL DATA:  Right ankle and foot pain.  No trauma. EXAM: RIGHT FOOT COMPLETE - 3+ VIEW; RIGHT ANKLE - 2 VIEW COMPARISON:  None. FINDINGS: No acute fracture or dislocation. The ankle mortise is symmetric. The talar dome is intact. 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 Remaining joint spaces are preserved. Osteopenia. Soft tissues are unremarkable. IMPRESSION: 1. No acute osseous abnormality of the right ankle or foot. 2. 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 Electronically Signed   By: Titus Dubin M.D.   On: 12/31/2017 19:14    Procedures Procedures (including critical care time)  Medications Ordered in ED Medications  0.9 %  sodium chloride infusion (Manually program via Guardrails IV Fluids) (has no administration in time range)  methylPREDNISolone sodium succinate (SOLU-MEDROL) 125 mg/2 mL injection 125 mg (has no administration in time range)  atorvastatin (LIPITOR) tablet 80 mg (80 mg Oral Given 12/31/17 2216)  losartan (COZAAR) tablet 100 mg (100 mg Oral Given 12/31/17 2216)  acetaminophen (TYLENOL) tablet  650 mg (650 mg Oral Given 12/31/17 2334)    Or  acetaminophen (TYLENOL) suppository 650 mg (  Rectal See Alternative 12/31/17 2334)  insulin aspart (novoLOG) injection 0-9 Units (0 Units Subcutaneous Not Given 01/01/18 0800)  ipratropium-albuterol (DUONEB) 0.5-2.5 (3) MG/3ML nebulizer solution 3 mL (has no administration in time range)  pantoprazole (PROTONIX) EC tablet 40 mg (40 mg Oral Given 01/01/18 0932)     Initial Impression / Assessment and Plan / ED Course  I have reviewed the triage vital signs and the nursing notes.  Pertinent labs & imaging results that were available during my care of the patient were reviewed by me and considered in my medical decision making (see chart for details).     Patient is an 84 year old male with past medical history as detailed above who presents to the emergency department for evaluation of generalized fatigue and shortness of breath with exertion over the last month.  Patient came to the emergency department today at the urging of his wife.  She has noticed that he has been becoming increasingly pale over the same time.  That he has had worsening fatigue.  He also complains of a productive cough that is been getting progressively worse over the past few weeks.  Secondary to patient's arrival complaint labs were obtained in triage and showed the patient is anemic with a hemoglobin of 5.9.  His chest x-ray does show findings consistent with chronic lung disease but no acute consolidation.  His remaining labs appear close to his baseline.  On exam patient does have conjunctival pallor and guaiac positive stool.  Given patient's presentation I believe his symptoms are secondary to a combination of symptomatic anemia as well as a mild COPD exacerbation.  Patient will be transfused while in the emergency department and given an IV dose of steroids.  The patient will then be admitted to the hospitalist service for further evaluation and care in the inpatient  setting.  The care of this patient was discussed with my attending physician Dr. Reather Converse, who voices agreement with work-up and ED disposition.  Final Clinical Impressions(s) / ED Diagnoses   Final diagnoses:  Symptomatic anemia  Shortness of breath  COPD exacerbation (HCC)  Ankle pain, right  Ankle pain, right    ED Discharge Orders    None       Marquice Uddin, Chanda Busing, MD 01/01/18 1213    Elnora Morrison, MD 01/03/18 1545

## 2017-12-31 NOTE — H&P (Addendum)
[facility] Admission History and Physical Service Pager: [phone]  Patient name: Philip Gray Medical record number: [id number] Date of birth: November 21, 1933 Age: 84 y.o. Gender: male  Primary Care Provider: Helane Rima, MD Consultants: none Code Status: FULL  Chief Complaint: fatigue  Assessment and Plan: Philip Gray is a 84 y.o. male presenting with symptomatic anemia. PMH is significant for AAA, tobacco use, glaucoma, HTN, DM2, PVD, COPD, CKD 3.   Fatigue: Likely secondary to symptomatic anemia.  Hemoglobin on arrival 5.9.  No recent hemoglobin in computer documentation, sees a PCP in care everywhere but no recent CBC.  Most likely cause of anemia is GI source with positive FOBT, reports of dark stools at home.  Given symptoms x1 month and vital signs stability, less likely acute GI bleed.  Patient states his been many years since his last colonoscopy which he reports he was told was normal.  No history of GI bleeds in the past.  Is on aspirin and Plavix but no other blood thinners.  No recent NSAID use.  No signs of infectious etiology for fatigue on work-up so far.  Status post 2 units packed red blood cells ordered by ED provider.  Patient denies cardiac or CVA history, making transfusion threshold 7.0. -Admit to MedSurg, Dr. Gwendlyn Deutscher attending -Consult GI in the morning -N.p.o. Midnight -Continue transfusion -Posttransfusion H&H  COPD: Status post Solu-Medrol by ED provider for?  COPD exacerbation.  Denies fever, endorses increased sputum production.  No home inhalers.  No wheezing on my exam. -Consider antibiotics in the a.m. -Duo nebs every 6 as needed  Right foot pain: States he has had a couple of days of medial right ankle pain.  No history of trauma to this, not sure if he could have twisted it.  We will obtain x-rays. -APAP as needed -X-ray as above  PVD: Takes Plavix and aspirin at home for peripheral vascular disease.  Denies history of MI.   Hold Plavix and aspirin pending GI recs. -Hold Plavix and aspirin  Hypertension: On losartan 100 mg, Norvasc 2.5. -Continue losartan -Hold amlodipine  CKD 3: Most recent creatinine as an outpatient 1.3 with GFR in the 50s.  Currently at baseline -Avoid nephrotoxic medications   Diabetes: Last A1c 6.7 on 08/23/2017.  On metformin at home. -Hold metformin -Sensitive sliding scale 2/2 steroids above  Hyperlipidemia: On atorvastatin at home may benefit from discussion with PCP regarding benefits of this at his age. -Continue atorvastatin  FEN/GI: Regular diet now, n.p.o. after midnight Prophylaxis: SCDs  Disposition: Admit to MedSurg  History of Present Illness:  Philip Gray is a 84 y.o. male presenting with SOB.   He presents today with his wife, daughter, grandson.  He lives at home.  Is independent in IADLs.  Reports generalized fatigue worsening over the last month.  No acute event to proceed these symptoms.  He reports last labs at his PCP sometime in February March.  He does not think any of these are abnormal.  Denies history of GI bleed or anemia.  Denies history of MI or CVA.  States he is on Plavix as his only blood thinner due to peripheral vascular disease.  Smokes 1 pack/day.  He says he has a diagnosis of COPD, but he does not have any inhalers either daily or as needed at home.  No recent infection, denies fever.  Does endorse increased clear sputum production.  States this is been going on for several weeks.  Denies history of heart failure  or fluid overload.  In the ED, hemoglobin found to be 5.9.  Transfused 2 units PRBC, status post Solu-Medrol for possible COPD exacerbation.  Vital signs stable, nontender abdomen.  Review Of Systems: Per HPI with the following additions: Denies fever, headache, vision changes, chest pain, abdominal pain, dysuria, changes in bowel movements.  ROS  Patient Active Problem List   Diagnosis Date Noted  . Aftercare following surgery of the  circulatory system, [place] 04/25/2013  . Atherosclerosis of native arteries of the extremities with intermittent claudication 03/24/2011  . Abdominal aneurysm without mention of rupture 03/24/2011  . SMOKER 02/12/2009  . COPD UNSPECIFIED 02/12/2009  . HYPERLIPIDEMIA 09/29/2007  . PULMONARY NODULE 09/29/2007    Past Medical History: Past Medical History:  Diagnosis Date  . Diabetes mellitus   . Hyperlipidemia   . Peripheral vascular disease ([place])   . Substance abuse ([place])    Tobacco abuse    Past Surgical History: Past Surgical History:  Procedure Laterality Date  . JOINT REPLACEMENT  2010   Left knee replacement    Social History: Social History   Tobacco Use  . Smoking status: Current Every Day Smoker    Packs/day: 1.00    Years: 45.00    Pack years: 45.00    Types: Cigarettes  . Smokeless tobacco: Never Used  Substance Use Topics  . Alcohol use: No  . Drug use: No   Additional social history: Lives with wife, smokes 1 pack/day Please also refer to relevant sections of EMR.  Family History: Family History  Problem Relation Age of Onset  . Cancer Sister   . Diabetes Brother   . Hyperlipidemia Mother   . Heart attack Father    Alergies and Medications: No Known Allergies No current facility-administered medications on file prior to encounter.    Current Outpatient Medications on File Prior to Encounter  Medication Sig Dispense Refill  . aspirin 81 MG tablet Take 160 mg by mouth daily.    Marland Kitchen atorvastatin (LIPITOR) 80 MG tablet Take 80 mg by mouth daily.    . clopidogrel (PLAVIX) 75 MG tablet Take 75 mg by mouth.    . losartan (COZAAR) 25 MG tablet Take 50 mg by mouth daily.     . metFORMIN (GLUCOPHAGE) 500 MG tablet Take 500 mg by mouth 2 (two) times daily with a meal.      Objective: BP (!) 137/51 (BP Location: Right Arm)   Pulse 93   Temp 98.2 F (36.8 C) (Oral)   Resp 20   SpO2 98%  Exam: General: Elderly male sitting up in bed no acute  distress Eyes: Pale conjunctiva, EOMI, PERRLA ENTM: Moist mucous membranes Neck: Supple Cardiovascular: Regular rate and rhythm, no murmurs Respiratory: Crackles in bilateral bases, good air movement, no wheezing Gastrointestinal: Soft nontender nondistended, positive bowel sounds MSK: Tender to palpation over medial aspect of right ankle no overlying edema, bruising, wound Derm: No skin changes on visualized skin Neuro: Cranial nerves II through XII grossly intact Psych: Mood and affect appropriate  Labs and Imaging: CBC BMET  Recent Labs  Lab 12/31/17 1416  WBC 7.7  HGB 5.9*  HCT 23.1*  PLT 376   Recent Labs  Lab 12/31/17 1416  NA 133*  K 3.8  CL 106  CO2 21*  BUN 16  CREATININE 1.39*  GLUCOSE 165*  CALCIUM 8.9      Sela Hilding, MD 12/31/2017, 6:00 PM PGY-3, [facility] Intern pager: [phone], text pages welcome

## 2017-12-31 NOTE — ED Notes (Signed)
Wife- [phone] Kendrick Fries) Daughter [phone] Peter Congo)

## 2017-12-31 NOTE — ED Triage Notes (Signed)
Pt presents to ED for assessment of SOB x 1  Month, but worsening in the past few days.  Exertional SOB, hx of COPD, patient still currently smokes 1 pack per day.  States he has been coughing up clear phlegm.  Denies chest pain.  C/o malaise and fatigue x 1 month.

## 2018-01-01 DIAGNOSIS — J449 Chronic obstructive pulmonary disease, unspecified: Secondary | ICD-10-CM

## 2018-01-01 DIAGNOSIS — D649 Anemia, unspecified: Secondary | ICD-10-CM

## 2018-01-01 LAB — TYPE AND SCREEN
ABO/RH(D): A POS
ANTIBODY SCREEN: NEGATIVE
UNIT DIVISION: 0
UNIT DIVISION: 0

## 2018-01-01 LAB — CBC
HEMATOCRIT: 27.8 % — AB (ref 39.0–52.0)
Hemoglobin: 8.1 g/dL — ABNORMAL LOW (ref 13.0–17.0)
MCH: 20.1 pg — ABNORMAL LOW (ref 26.0–34.0)
MCHC: 29.1 g/dL — ABNORMAL LOW (ref 30.0–36.0)
MCV: 69.2 fL — AB (ref 80.0–100.0)
Platelets: 289 10*3/uL (ref 150–400)
RBC: 4.02 MIL/uL — AB (ref 4.22–5.81)
RDW: 19.7 % — ABNORMAL HIGH (ref 11.5–15.5)
WBC: 6.8 10*3/uL (ref 4.0–10.5)
nRBC: 0.3 % — ABNORMAL HIGH (ref 0.0–0.2)

## 2018-01-01 LAB — GLUCOSE, CAPILLARY
Glucose-Capillary: 84 mg/dL (ref 70–99)
Glucose-Capillary: 85 mg/dL (ref 70–99)
Glucose-Capillary: 92 mg/dL (ref 70–99)
Glucose-Capillary: 85 mg/dL (ref 70–99)
Glucose-Capillary: 111 mg/dL — ABNORMAL HIGH (ref 70–99)

## 2018-01-01 LAB — BASIC METABOLIC PANEL
Anion gap: 6 (ref 5–15)
BUN: 13 mg/dL (ref 8–23)
CHLORIDE: 109 mmol/L (ref 98–111)
CO2: 20 mmol/L — ABNORMAL LOW (ref 22–32)
CREATININE: 1.35 mg/dL — AB (ref 0.61–1.24)
Calcium: 8.8 mg/dL — ABNORMAL LOW (ref 8.9–10.3)
GFR calc Af Amer: 55 mL/min — ABNORMAL LOW (ref >60)
GFR calc non Af Amer: 47 mL/min — ABNORMAL LOW (ref >60)
GLUCOSE: 93 mg/dL (ref 70–99)
POTASSIUM: 3.8 mmol/L (ref 3.5–5.1)
Sodium: 135 mmol/L (ref 135–145)

## 2018-01-01 LAB — BPAM RBC
BLOOD PRODUCT EXPIRATION DATE: 201911292359
Blood Product Expiration Date: 201911292359
ISSUE DATE / TIME: 201911022219
ISSUE DATE / TIME: 201911021753
UNIT TYPE AND RH: 6200
Unit Type and Rh: 6200

## 2018-01-01 LAB — HEMOGLOBIN A1C
HEMOGLOBIN A1C: 5.6 % (ref 4.8–5.6)
MEAN PLASMA GLUCOSE: 114.02 mg/dL

## 2018-01-01 LAB — PROTIME-INR
INR: 1.22
Prothrombin Time: 15.3 seconds — ABNORMAL HIGH (ref 11.4–15.2)

## 2018-01-01 MED ORDER — PANTOPRAZOLE SODIUM 40 MG PO TBEC
40.0000 mg | DELAYED_RELEASE_TABLET | Freq: Two times a day (BID) | ORAL | Status: DC
  Administered 2018-01-01 (×2): 40 mg via ORAL
  Filled 2018-01-01 (×2): qty 1

## 2018-01-01 NOTE — Progress Notes (Signed)
Family Medicine Teaching Service Daily Progress Note Intern Pager: [phone]  Patient name: Philip Gray Medical record number: [id number] Date of birth: [birth year] Age: 84 y.o. Gender: male  Primary Care Provider: Helane Rima, MD Consultants: GI Code Status: Full  Pt Overview and Major Events to Date:  11/2-admitted for symptomatic anemia  Philip Gray is a 84 y.o. male presenting with symptomatic anemia. PMH is significant for AAA, tobacco use, glaucoma, HTN, DM2, PVD, COPD, CKD 3.   Fatigue: Likely secondary to symptomatic anemia. Most likely cause of anemia is GI source with positive FOBT, reports of dark stools x1 month. Is on aspirin and Plavix but no other blood thinners. Patient denies cardiac or CVA history, making transfusion threshold 7.0. Hemoglobin improved from 5.9 on admission to 8.1 status post transfusion 2 units PRBCs. -GI consulted - appreciate recommendations -NPO Midnight  COPD, stable, improved: Status post Solu-Medrol by ED provider for? COPD exacerbation.  Denies fever, endorses increased sputum production.  No home inhalers.  No wheezing on exam. -Duo nebs every 6 as needed  Right foot pain: States he has had a couple of days of medial right ankle pain. No history of trauma to this, not sure if he could have twisted it.  X-ray 11/2 without acute fracture or dislocation.  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  Soft tissues unremarkable. We will obtain x-rays. -Tylenol as needed  PVD: Takes Plavix and aspirin at home for peripheral vascular disease.  Denies history of MI.   - Hold Plavix and aspirin pending GI recs  Hypertension: On losartan 100 mg, Norvasc 2.5. Blood pressure 141/48 (11/3).   -Continue losartan -Hold amlodipine  CKD 3: Most recent creatinine as an outpatient 1.3 with GFR in the 50s. Currently at baseline -Avoid nephrotoxic medications   Diabetes: Last A1c 6.7 on 08/23/2017.  On metformin at  home. -Hold metformin -Sensitive sliding scale 2/2 steroids above  Hyperlipidemia: On atorvastatin at home may benefit from discussion with PCP regarding benefits of this at his age. -Continue atorvastatin  FEN/GI: Regular diet now, n.p.o. after midnight Prophylaxis: SCDs  Disposition: Admit to MedSurg  Subjective:  Patient seen this morning with wife and daughter in the room.  He is up and moving with physical therapy and reports feeling much better.  He denies fatigue, chest pain, nausea, vomiting, and constipation.  He reports having continued dark stools which are unchanged from "several months".  He has no other concerns at this time.  Objective: Temp:  [98 F (36.7 C)-99.8 F (37.7 C)] 99.8 F (37.7 C) (11/03 0604) Pulse Rate:  [71-93] 78 (11/03 0604) Resp:  [15-20] 16 (11/03 0604) BP: (135-163)/(48-69) 141/48 (11/03 0604) SpO2:  [93 %-100 %] 96 % (11/03 0604)  Physical Exam  Constitutional: He appears well-developed and well-nourished.  HENT:  Head: Normocephalic.  Eyes: EOM are normal.  Cardiovascular: Normal rate and normal heart sounds.  Pulmonary/Chest: Effort normal.  Abdominal: Soft. Bowel sounds are normal. He exhibits no distension. There is no tenderness.  Musculoskeletal: Normal range of motion.       Right lower leg: He exhibits no edema.       Left lower leg: He exhibits no edema.  Neurological: He is alert.  Skin: Skin is warm and dry. Capillary refill takes less than 2 seconds.  Psychiatric: He has a normal mood and affect. His behavior is normal.   Laboratory: Recent Labs  Lab 12/31/17 1416 01/01/18 0552  WBC 7.7 6.8  HGB 5.9* 8.1*  HCT 23.1* 27.8*  PLT 376 289   Recent Labs  Lab 12/31/17 1416 01/01/18 0552  NA 133* 135  K 3.8 3.8  CL 106 109  CO2 21* 20*  BUN 16 13  CREATININE 1.39* 1.35*  CALCIUM 8.9 8.8*  PROT 7.0  --   BILITOT 0.8  --   ALKPHOS 101  --   ALT 39  --   AST 34  --   GLUCOSE 165* 93   INR (11/3)  1.22  Imaging/Diagnostic Tests: Dg Chest 2 View  Result Date: 12/31/2017 CLINICAL DATA:  Pt presents to ED for assessment of SOB x 1 Month, but worsening in the past few days. Exertional SOB. Patient reports coughing up phlegm HX COPD, smokerCOPD, worsening SOB, cough EXAM: CHEST - 2 VIEW COMPARISON:  Radiograph 12/09/2009 FINDINGS: Normal cardiac silhouette. There is bilateral fine interstitial pattern which is increased from comparison exam 2011. LEFT upper lobe nodule measuring 10 mm is not changed from radiograph 2011. No infiltrate. No pneumothorax. No acute osseous abnormality. IMPRESSION: 1. Bilateral interstitial pattern is favored chronic interstitial lung disease. Cannot exclude mild superimposed interstitial edema. 2. No change in LEFT upper lobe nodule over multiple years consistent benign etiology. Electronically Signed   By: Suzy Bouchard M.D.   On: 12/31/2017 15:07   Dg Ankle 2 Views Right  Result Date: 12/31/2017 CLINICAL DATA:  Right ankle and foot pain.  No trauma. EXAM: RIGHT FOOT COMPLETE - 3+ VIEW; RIGHT ANKLE - 2 VIEW COMPARISON:  None. FINDINGS: No acute fracture or dislocation. The ankle mortise is symmetric. The talar dome is intact. 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 Remaining joint spaces are preserved. Osteopenia. Soft tissues are unremarkable. IMPRESSION: 1. No acute osseous abnormality of the right ankle or foot. 2. 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 Electronically Signed   By: Titus Dubin M.D.   On: 12/31/2017 19:14   Dg Foot Complete Right  Result Date: 12/31/2017 CLINICAL DATA:  Right ankle and foot pain.  No trauma. EXAM: RIGHT FOOT COMPLETE - 3+ VIEW; RIGHT ANKLE - 2 VIEW COMPARISON:  None. FINDINGS: No acute fracture or dislocation. The ankle mortise is symmetric. The talar dome is intact. 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 Remaining joint spaces are preserved. Osteopenia. Soft tissues are unremarkable. IMPRESSION: 1.  No acute osseous abnormality of the right ankle or foot. 2. 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 Electronically Signed   By: Titus Dubin M.D.   On: 12/31/2017 19:14    Daisy Floro, DO 01/01/2018, 9:15 AM PGY-1, [facility] Intern pager: [phone], text pages welcome

## 2018-01-01 NOTE — Evaluation (Signed)
Occupational Therapy Evaluation Patient Details Name: Philip Gray MRN: [id number] DOB: March 27, 1933 Today's Date: 01/01/2018    History of Present Illness 84 y.o. male presenting with symptomatic anemia. PMH is significant for AAA, tobacco use, glaucoma, HTN, DM2, PVD, COPD, CKD 3.    Clinical Impression   PTA, pt was living with his wife and was independent; family performing 69 of IADLs. Pt performing ADLs and functional mobility at supervision level with increased time as needed. Discussing use of shower seat for energy conservation during bathing; family and pt verbalize understanding. Pt presenting near baseline function and all acute OT needs met. Recommend dc home once medically stable per physician.     Follow Up Recommendations  No OT follow up;Supervision/Assistance - 24 hour    Equipment Recommendations  None recommended by OT    Recommendations for Other Services       Precautions / Restrictions Restrictions Weight Bearing Restrictions: No      Mobility Bed Mobility Overal bed mobility: Modified Independent             General bed mobility comments: increased time to perform  Transfers Overall transfer level: Modified independent               General transfer comment: increased time to rise to standing    Balance Overall balance assessment: Mild deficits observed, not formally tested                           High level balance activites: Head turns High Level Balance Comments: slow and guarded, modest instability            ADL either performed or assessed with clinical judgement   ADL Overall ADL's : Needs assistance/impaired Eating/Feeding: Independent;Sitting   Grooming: Oral care;Supervision/safety;Standing   Upper Body Bathing: Supervision/ safety;Sitting   Lower Body Bathing: Supervison/ safety;Sit to/from stand   Upper Body Dressing : Supervision/safety;Sitting Upper Body Dressing Details (indicate cue type and  reason): Donning second gown Lower Body Dressing: Supervision/safety;Sit to/from stand   Toilet Transfer: Supervision/safety;Ambulation(simulated in room)         Tub/Shower Transfer Details (indicate cue type and reason): Discussed with wife use of shower seat and how to purchase.  Functional mobility during ADLs: Supervision/safety General ADL Comments: Performing at supervision level near baseline     Vision         Perception     Praxis      Pertinent Vitals/Pain Pain Assessment: Faces Faces Pain Scale: Hurts little more Pain Location: right ankle Pain Descriptors / Indicators: Sore Pain Intervention(s): Monitored during session     Hand Dominance Right   Extremity/Trunk Assessment Upper Extremity Assessment Upper Extremity Assessment: Generalized weakness   Lower Extremity Assessment Lower Extremity Assessment: Generalized weakness RLE Deficits / Details: Right ankle pain   Cervical / Trunk Assessment Cervical / Trunk Assessment: Normal   Communication Communication Communication: No difficulties   Cognition Arousal/Alertness: Awake/alert Behavior During Therapy: WFL for tasks assessed/performed Overall Cognitive Status: Within Functional Limits for tasks assessed                                     General Comments  Wife and family present throughout session    Exercises     Shoulder Instructions      Home Living Family/patient expects to be discharged to:: Private residence Living Arrangements: Spouse/significant other Available  Help at Discharge: Family Type of Home: House Home Access: Stairs to enter [employer] of Steps: 2(1+1)   Home Layout: One level     Bathroom Shower/Tub: Occupational psychologist: Handicapped height     Home Equipment: [place] - single point          Prior Functioning/Environment Level of Independence: Independent        Comments: Performs ADLs. Family performs IADLs         OT Problem List: Decreased activity tolerance;Impaired balance (sitting and/or standing);Pain      OT Treatment/Interventions:      OT Goals(Current goals can be found in the care plan section) Acute Rehab OT Goals Patient Stated Goal: "Go home" OT Goal Formulation: All assessment and education complete, DC therapy  OT Frequency:     Barriers to D/C:            Co-evaluation              AM-PAC PT "6 Clicks" Daily Activity     Outcome Measure Help from another person eating meals?: None Help from another person taking care of personal grooming?: None Help from another person toileting, which includes using toliet, bedpan, or urinal?: None Help from another person bathing (including washing, rinsing, drying)?: None Help from another person to put on and taking off regular upper body clothing?: None Help from another person to put on and taking off regular lower body clothing?: None 6 Click Score: 24   End of Session Nurse Communication: Mobility status  Activity Tolerance: Patient tolerated treatment well Patient left: in bed;with call bell/phone within reach;with family/visitor present(with doctor)  OT Visit Diagnosis: Unsteadiness on feet (R26.81);Other abnormalities of gait and mobility (R26.89);Muscle weakness (generalized) (M62.81);Pain Pain - Right/Left: Right Pain - part of body: Ankle and joints of foot                Time: 1610-9604 OT Time Calculation (min): 8 min Charges:  OT General Charges $OT Visit: 1 Visit OT Evaluation $OT Eval Low Complexity: [place], OTR/L Acute Rehab Pager: [phone] Office: [place] 01/01/2018, 12:41 PM

## 2018-01-01 NOTE — Consult Note (Signed)
Referring Provider:  Dr. Ouida Sills Primary Care Physician:  Helane Rima, MD Primary Gastroenterologist: Althia Forts  Reason for Consultation: Symptomatic anemia, positive FOBT  HPI: Philip Gray is a 84 y.o. male with past medical history of peripheral vascular disease currently on Plavix, COPD,  chronic kidney disease, diabetes presented to the hospital with fatigue and weakness.  Complaining of dark stools.  He was found to have anemia with hemoglobin of 5.9 .  GI is consulted for further evaluation.  Patient seen and examined at bedside.  Complaining of fatigue and weakness for last 1 week.  He has been seeing dark stool for last 3 to 4 days but he denies any black tarry stool or bright red blood per rectum.  Denies diarrhea constipation.  Denies abdominal pain, nausea vomiting.  Complaining of occasional reflux but denies dysphagia odynophagia.  Denied use of NSAIDs.  Had a colonoscopy 10 years ago in [place] which was normal according to patient.  No family history of colon cancer  Past Medical History:  Diagnosis Date  . Diabetes mellitus   . Hyperlipidemia   . Peripheral vascular disease ([place])   . Substance abuse ([place])    Tobacco abuse    Past Surgical History:  Procedure Laterality Date  . JOINT REPLACEMENT  2010   Left knee replacement    Prior to Admission medications   Medication Sig Start Date End Date Taking? Authorizing Provider  amLODipine (NORVASC) 2.5 MG tablet Take 2.5 mg by mouth at bedtime. 10/24/17  Yes [provider]  aspirin EC 81 MG tablet Take 81 mg by mouth daily.   Yes [provider]  atorvastatin (LIPITOR) 80 MG tablet Take 80 mg by mouth at bedtime.    Yes [provider]  clopidogrel (PLAVIX) 75 MG tablet Take 75 mg by mouth at bedtime.  11/13/13  Yes [provider]  losartan (COZAAR) 100 MG tablet Take 100 mg by mouth at bedtime.  02/13/15 07/01/18 Yes [provider]  metFORMIN (GLUCOPHAGE) 500 MG tablet  Take 500 mg by mouth at bedtime.    Yes [provider]    Scheduled Meds: . sodium chloride   Intravenous Once  . atorvastatin  80 mg Oral QHS  . insulin aspart  0-9 Units Subcutaneous TID WC  . losartan  100 mg Oral QHS  . methylPREDNISolone (SOLU-MEDROL) injection  125 mg Intravenous Once   Continuous Infusions: PRN Meds:.acetaminophen **OR** acetaminophen, ipratropium-albuterol  Allergies as of 12/31/2017  . (No Known Allergies)    Family History  Problem Relation Age of Onset  . Cancer Sister   . Diabetes Brother   . Hyperlipidemia Mother   . Heart attack Father     Social History   Socioeconomic History  . Marital status: Married    Spouse name: Not on file  . Number of children: Not on file  . Years of education: Not on file  . Highest education level: Not on file  Occupational History  . Not on file  Social Needs  . Financial resource strain: Not on file  . Food insecurity:    Worry: Not on file    Inability: Not on file  . Transportation needs:    Medical: Not on file    Non-medical: Not on file  Tobacco Use  . Smoking status: Current Every Day Smoker    Packs/day: 1.00    Years: 45.00    Pack years: 45.00    Types: Cigarettes  . Smokeless tobacco: Never Used  Substance  and Sexual Activity  . Alcohol use: No  . Drug use: No  . Sexual activity: Not on file  Lifestyle  . Physical activity:    Days per week: Not on file    Minutes per session: Not on file  . Stress: Not on file  Relationships  . Social connections:    Talks on phone: Not on file    Gets together: Not on file    Attends religious service: Not on file    Active member of club or organization: Not on file    Attends meetings of clubs or organizations: Not on file    Relationship status: Not on file  . Intimate partner violence:    Fear of current or ex partner: Not on file    Emotionally abused: Not on file    Physically abused: Not on file    Forced sexual activity:  Not on file  Other Topics Concern  . Not on file  Social History Narrative  . Not on file    Review of Systems: Review of Systems  Constitutional: Positive for malaise/fatigue. Negative for chills and fever.  HENT: Negative for hearing loss and tinnitus.   Eyes: Negative for blurred vision and double vision.  Respiratory: Positive for shortness of breath. Negative for cough and hemoptysis.   Cardiovascular: Negative for chest pain and palpitations.  Gastrointestinal: Positive for heartburn. Negative for abdominal pain, constipation, diarrhea, nausea and vomiting.  Genitourinary: Negative for dysuria and urgency.  Musculoskeletal: Positive for myalgias. Negative for neck pain.  Skin: Negative for itching and rash.  Neurological: Positive for weakness. Negative for seizures.  Endo/Heme/Allergies: Does not bruise/bleed easily.  Psychiatric/Behavioral: Negative for hallucinations and suicidal ideas.    Physical Exam: Vital signs: Vitals:   01/01/18 0104 01/01/18 0604  BP: (!) 135/57 (!) 141/48  Pulse: 79 78  Resp:  16  Temp: 98.5 F (36.9 C) 99.8 F (37.7 C)  SpO2: 93% 96%   Last BM Date: 12/31/17(before getting to unit) Physical Exam  Constitutional: He is oriented to person, place, and time. He appears well-developed and well-nourished. No distress.  HENT:  Head: Normocephalic and atraumatic.  Mouth/Throat: Oropharynx is clear and moist. No oropharyngeal exudate.  Eyes: EOM are normal. No scleral icterus.  Neck: Normal range of motion. Neck supple.  Cardiovascular: Normal rate, regular rhythm and normal heart sounds.  Pulmonary/Chest: Effort normal and breath sounds normal. No respiratory distress.  Abdominal: Soft. Bowel sounds are normal. He exhibits no distension. There is no tenderness. There is no rebound.  Musculoskeletal: Normal range of motion. He exhibits no edema.  Neurological: He is alert and oriented to person, place, and time.  Skin: Skin is dry. No  erythema.  Psychiatric: He has a normal mood and affect. Judgment and thought content normal.  Vitals reviewed.   GI:  Lab Results: Recent Labs    12/31/17 1416 01/01/18 0552  WBC 7.7 6.8  HGB 5.9* 8.1*  HCT 23.1* 27.8*  PLT 376 289   BMET Recent Labs    12/31/17 1416 01/01/18 0552  NA 133* 135  K 3.8 3.8  CL 106 109  CO2 21* 20*  GLUCOSE 165* 93  BUN 16 13  CREATININE 1.39* 1.35*  CALCIUM 8.9 8.8*   LFT Recent Labs    12/31/17 1416  PROT 7.0  ALBUMIN 3.4*  AST 34  ALT 39  ALKPHOS 101  BILITOT 0.8   PT/INR Recent Labs    01/01/18 0552  LABPROT 15.3*  INR  1.22     Studies/Results: Dg Chest 2 View  Result Date: 12/31/2017 CLINICAL DATA:  Pt presents to ED for assessment of SOB x 1 Month, but worsening in the past few days. Exertional SOB. Patient reports coughing up phlegm HX COPD, smokerCOPD, worsening SOB, cough EXAM: CHEST - 2 VIEW COMPARISON:  Radiograph 12/09/2009 FINDINGS: Normal cardiac silhouette. There is bilateral fine interstitial pattern which is increased from comparison exam 2011. LEFT upper lobe nodule measuring 10 mm is not changed from radiograph 2011. No infiltrate. No pneumothorax. No acute osseous abnormality. IMPRESSION: 1. Bilateral interstitial pattern is favored chronic interstitial lung disease. Cannot exclude mild superimposed interstitial edema. 2. No change in LEFT upper lobe nodule over multiple years consistent benign etiology. Electronically Signed   By: Suzy Bouchard M.D.   On: 12/31/2017 15:07   Dg Ankle 2 Views Right  Result Date: 12/31/2017 CLINICAL DATA:  Right ankle and foot pain.  No trauma. EXAM: RIGHT FOOT COMPLETE - 3+ VIEW; RIGHT ANKLE - 2 VIEW COMPARISON:  None. FINDINGS: No acute fracture or dislocation. The ankle mortise is symmetric. The talar dome is intact. 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 Remaining joint spaces are preserved. Osteopenia. Soft tissues are unremarkable. IMPRESSION: 1. No acute  osseous abnormality of the right ankle or foot. 2. 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 Electronically Signed   By: Titus Dubin M.D.   On: 12/31/2017 19:14   Dg Foot Complete Right  Result Date: 12/31/2017 CLINICAL DATA:  Right ankle and foot pain.  No trauma. EXAM: RIGHT FOOT COMPLETE - 3+ VIEW; RIGHT ANKLE - 2 VIEW COMPARISON:  None. FINDINGS: No acute fracture or dislocation. The ankle mortise is symmetric. The talar dome is intact. 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 Remaining joint spaces are preserved. Osteopenia. Soft tissues are unremarkable. IMPRESSION: 1. No acute osseous abnormality of the right ankle or foot. 2. 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 Electronically Signed   By: Titus Dubin M.D.   On: 12/31/2017 19:14    Impression/Plan: -Symptomatic anemia with hemoglobin of 5.9 on admission.  Status post blood transfusion. -Dark stools for last 3 to 4 days.  FOBT positive. -Peripheral vascular disease.  Plavix on hold.  Last dose 11/01  Recommendations ------------------------- -Start Protonix 40 mg twice daily. -EGD tomorrow. -Monitor H&H. -Okay to have a full liquid diet today.  N.p.o. past midnight.  Risks (bleeding, infection, bowel perforation that could require surgery, sedation-related changes in cardiopulmonary systems), benefits (identification and possible treatment of source of symptoms, exclusion of certain causes of symptoms), and alternatives (watchful waiting, radiographic imaging studies, empiric medical treatment)  were explained to patient in detail and patient wishes to proceed.   LOS: 1 day   Otis Brace  MD, FACP 01/01/2018, 8:55 AM  Contact #  [phone]

## 2018-01-01 NOTE — Evaluation (Signed)
Physical Therapy Evaluation Patient Details Name: Philip Gray MRN: [id number] DOB: 1934/01/31 Today's Date: 01/01/2018   History of Present Illness  84 y.o. male presenting with symptomatic anemia. PMH is significant for AAA, tobacco use, glaucoma, HTN, DM2, PVD, COPD, CKD 3.   Clinical Impression  Patient seen for therapy assessment.  Mobilizing well with no noted focal deficits at this time. No further acute PT needs. Will sign off.     Follow Up Recommendations No PT follow up;Supervision - Intermittent    Equipment Recommendations  None recommended by PT    Recommendations for Other Services       Precautions / Restrictions        Mobility  Bed Mobility Overal bed mobility: Modified Independent             General bed mobility comments: increased time to perform  Transfers Overall transfer level: Modified independent               General transfer comment: increased time to rise to standing  Ambulation/Gait Ambulation/Gait assistance: Supervision Gait Distance (Feet): 160 Feet Assistive device: None Gait Pattern/deviations: Step-through pattern;Decreased stride length;Drifts right/left Gait velocity: decreased   General Gait Details: some modest instability during ambulation, no physical assist required. slow gait speed(intermittent antalgic gait)  Stairs            Wheelchair Mobility    Modified Rankin (Stroke Patients Only)       Balance Overall balance assessment: Mild deficits observed, not formally tested                           High level balance activites: Head turns High Level Balance Comments: slow and guarded, modest instability              Pertinent Vitals/Pain Pain Assessment: Faces Faces Pain Scale: Hurts little more Pain Location: right ankle Pain Descriptors / Indicators: Sore Pain Intervention(s): Monitored during session    Home Living Family/patient expects to be discharged to:: Private  residence Living Arrangements: Spouse/significant other Available Help at Discharge: Family Type of Home: House Home Access: Stairs to enter   Technical brewer of Steps: 2(1+1) Home Layout: One level        Prior Function Level of Independence: Independent               Hand Dominance        Extremity/Trunk Assessment   Upper Extremity Assessment Upper Extremity Assessment: Generalized weakness    Lower Extremity Assessment Lower Extremity Assessment: Generalized weakness;RLE deficits/detail RLE Deficits / Details: Right ankle pain       Communication      Cognition Arousal/Alertness: Awake/alert Behavior During Therapy: WFL for tasks assessed/performed Overall Cognitive Status: Within Functional Limits for tasks assessed                                        General Comments      Exercises     Assessment/Plan    PT Assessment Patent does not need any further PT services  PT Problem List         PT Treatment Interventions      PT Goals (Current goals can be found in the Care Plan section)  Acute Rehab PT Goals PT Goal Formulation: All assessment and education complete, DC therapy    Frequency     Barriers to  discharge        Co-evaluation               AM-PAC PT "6 Clicks" Daily Activity  Outcome Measure Difficulty turning over in bed (including adjusting bedclothes, sheets and blankets)?: A Little Difficulty moving from lying on back to sitting on the side of the bed? : A Little Difficulty sitting down on and standing up from a chair with arms (e.g., wheelchair, bedside commode, etc,.)?: A Little Help needed moving to and from a bed to chair (including a wheelchair)?: A Little Help needed walking in hospital room?: A Little Help needed climbing 3-5 steps with a railing? : A Little 6 Click Score: 18    End of Session Equipment Utilized During Treatment: Gait belt Activity Tolerance: Patient tolerated  treatment well Patient left: (with OT and MD) Nurse Communication: Mobility status PT Visit Diagnosis: Muscle weakness (generalized) (M62.81)    Time: 0223-3612 PT Time Calculation (min) (ACUTE ONLY): 8 min   Charges:   PT Evaluation $PT Eval Low Complexity: [place], PT DPT  Board Certified Neurologic Specialist Acute Rehabilitation Services Pager [phone] Office [phone]   Duncan Dull 01/01/2018, 9:57 AM

## 2018-01-01 NOTE — H&P (View-Only) (Signed)
Referring Provider:  Dr. Ouida Sills Primary Care Physician:  Helane Rima, MD Primary Gastroenterologist: Althia Forts  Reason for Consultation: Symptomatic anemia, positive FOBT  HPI: Philip Gray is a 84 y.o. male with past medical history of peripheral vascular disease currently on Plavix, COPD,  chronic kidney disease, diabetes presented to the hospital with fatigue and weakness.  Complaining of dark stools.  He was found to have anemia with hemoglobin of 5.9 .  GI is consulted for further evaluation.  Patient seen and examined at bedside.  Complaining of fatigue and weakness for last 1 week.  He has been seeing dark stool for last 3 to 4 days but he denies any black tarry stool or bright red blood per rectum.  Denies diarrhea constipation.  Denies abdominal pain, nausea vomiting.  Complaining of occasional reflux but denies dysphagia odynophagia.  Denied use of NSAIDs.  Had a colonoscopy 10 years ago in [place] which was normal according to patient.  No family history of colon cancer  Past Medical History:  Diagnosis Date  . Diabetes mellitus   . Hyperlipidemia   . Peripheral vascular disease ([place])   . Substance abuse ([place])    Tobacco abuse    Past Surgical History:  Procedure Laterality Date  . JOINT REPLACEMENT  2010   Left knee replacement    Prior to Admission medications   Medication Sig Start Date End Date Taking? Authorizing Provider  amLODipine (NORVASC) 2.5 MG tablet Take 2.5 mg by mouth at bedtime. 10/24/17  Yes [provider]  aspirin EC 81 MG tablet Take 81 mg by mouth daily.   Yes [provider]  atorvastatin (LIPITOR) 80 MG tablet Take 80 mg by mouth at bedtime.    Yes [provider]  clopidogrel (PLAVIX) 75 MG tablet Take 75 mg by mouth at bedtime.  11/13/13  Yes [provider]  losartan (COZAAR) 100 MG tablet Take 100 mg by mouth at bedtime.  02/13/15 07/01/18 Yes [provider]  metFORMIN (GLUCOPHAGE) 500 MG tablet  Take 500 mg by mouth at bedtime.    Yes [provider]    Scheduled Meds: . sodium chloride   Intravenous Once  . atorvastatin  80 mg Oral QHS  . insulin aspart  0-9 Units Subcutaneous TID WC  . losartan  100 mg Oral QHS  . methylPREDNISolone (SOLU-MEDROL) injection  125 mg Intravenous Once   Continuous Infusions: PRN Meds:.acetaminophen **OR** acetaminophen, ipratropium-albuterol  Allergies as of 12/31/2017  . (No Known Allergies)    Family History  Problem Relation Age of Onset  . Cancer Sister   . Diabetes Brother   . Hyperlipidemia Mother   . Heart attack Father     Social History   Socioeconomic History  . Marital status: Married    Spouse name: Not on file  . Number of children: Not on file  . Years of education: Not on file  . Highest education level: Not on file  Occupational History  . Not on file  Social Needs  . Financial resource strain: Not on file  . Food insecurity:    Worry: Not on file    Inability: Not on file  . Transportation needs:    Medical: Not on file    Non-medical: Not on file  Tobacco Use  . Smoking status: Current Every Day Smoker    Packs/day: 1.00    Years: 45.00    Pack years: 45.00    Types: Cigarettes  . Smokeless tobacco: Never Used  Substance  and Sexual Activity  . Alcohol use: No  . Drug use: No  . Sexual activity: Not on file  Lifestyle  . Physical activity:    Days per week: Not on file    Minutes per session: Not on file  . Stress: Not on file  Relationships  . Social connections:    Talks on phone: Not on file    Gets together: Not on file    Attends religious service: Not on file    Active member of club or organization: Not on file    Attends meetings of clubs or organizations: Not on file    Relationship status: Not on file  . Intimate partner violence:    Fear of current or ex partner: Not on file    Emotionally abused: Not on file    Physically abused: Not on file    Forced sexual activity:  Not on file  Other Topics Concern  . Not on file  Social History Narrative  . Not on file    Review of Systems: Review of Systems  Constitutional: Positive for malaise/fatigue. Negative for chills and fever.  HENT: Negative for hearing loss and tinnitus.   Eyes: Negative for blurred vision and double vision.  Respiratory: Positive for shortness of breath. Negative for cough and hemoptysis.   Cardiovascular: Negative for chest pain and palpitations.  Gastrointestinal: Positive for heartburn. Negative for abdominal pain, constipation, diarrhea, nausea and vomiting.  Genitourinary: Negative for dysuria and urgency.  Musculoskeletal: Positive for myalgias. Negative for neck pain.  Skin: Negative for itching and rash.  Neurological: Positive for weakness. Negative for seizures.  Endo/Heme/Allergies: Does not bruise/bleed easily.  Psychiatric/Behavioral: Negative for hallucinations and suicidal ideas.    Physical Exam: Vital signs: Vitals:   01/01/18 0104 01/01/18 0604  BP: (!) 135/57 (!) 141/48  Pulse: 79 78  Resp:  16  Temp: 98.5 F (36.9 C) 99.8 F (37.7 C)  SpO2: 93% 96%   Last BM Date: 12/31/17(before getting to unit) Physical Exam  Constitutional: He is oriented to person, place, and time. He appears well-developed and well-nourished. No distress.  HENT:  Head: Normocephalic and atraumatic.  Mouth/Throat: Oropharynx is clear and moist. No oropharyngeal exudate.  Eyes: EOM are normal. No scleral icterus.  Neck: Normal range of motion. Neck supple.  Cardiovascular: Normal rate, regular rhythm and normal heart sounds.  Pulmonary/Chest: Effort normal and breath sounds normal. No respiratory distress.  Abdominal: Soft. Bowel sounds are normal. He exhibits no distension. There is no tenderness. There is no rebound.  Musculoskeletal: Normal range of motion. He exhibits no edema.  Neurological: He is alert and oriented to person, place, and time.  Skin: Skin is dry. No  erythema.  Psychiatric: He has a normal mood and affect. Judgment and thought content normal.  Vitals reviewed.   GI:  Lab Results: Recent Labs    12/31/17 1416 01/01/18 0552  WBC 7.7 6.8  HGB 5.9* 8.1*  HCT 23.1* 27.8*  PLT 376 289   BMET Recent Labs    12/31/17 1416 01/01/18 0552  NA 133* 135  K 3.8 3.8  CL 106 109  CO2 21* 20*  GLUCOSE 165* 93  BUN 16 13  CREATININE 1.39* 1.35*  CALCIUM 8.9 8.8*   LFT Recent Labs    12/31/17 1416  PROT 7.0  ALBUMIN 3.4*  AST 34  ALT 39  ALKPHOS 101  BILITOT 0.8   PT/INR Recent Labs    01/01/18 0552  LABPROT 15.3*  INR  1.22     Studies/Results: Dg Chest 2 View  Result Date: 12/31/2017 CLINICAL DATA:  Pt presents to ED for assessment of SOB x 1 Month, but worsening in the past few days. Exertional SOB. Patient reports coughing up phlegm HX COPD, smokerCOPD, worsening SOB, cough EXAM: CHEST - 2 VIEW COMPARISON:  Radiograph 12/09/2009 FINDINGS: Normal cardiac silhouette. There is bilateral fine interstitial pattern which is increased from comparison exam 2011. LEFT upper lobe nodule measuring 10 mm is not changed from radiograph 2011. No infiltrate. No pneumothorax. No acute osseous abnormality. IMPRESSION: 1. Bilateral interstitial pattern is favored chronic interstitial lung disease. Cannot exclude mild superimposed interstitial edema. 2. No change in LEFT upper lobe nodule over multiple years consistent benign etiology. Electronically Signed   By: Suzy Bouchard M.D.   On: 12/31/2017 15:07   Dg Ankle 2 Views Right  Result Date: 12/31/2017 CLINICAL DATA:  Right ankle and foot pain.  No trauma. EXAM: RIGHT FOOT COMPLETE - 3+ VIEW; RIGHT ANKLE - 2 VIEW COMPARISON:  None. FINDINGS: No acute fracture or dislocation. The ankle mortise is symmetric. The talar dome is intact. 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 Remaining joint spaces are preserved. Osteopenia. Soft tissues are unremarkable. IMPRESSION: 1. No acute  osseous abnormality of the right ankle or foot. 2. 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 Electronically Signed   By: Titus Dubin M.D.   On: 12/31/2017 19:14   Dg Foot Complete Right  Result Date: 12/31/2017 CLINICAL DATA:  Right ankle and foot pain.  No trauma. EXAM: RIGHT FOOT COMPLETE - 3+ VIEW; RIGHT ANKLE - 2 VIEW COMPARISON:  None. FINDINGS: No acute fracture or dislocation. The ankle mortise is symmetric. The talar dome is intact. 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 Remaining joint spaces are preserved. Osteopenia. Soft tissues are unremarkable. IMPRESSION: 1. No acute osseous abnormality of the right ankle or foot. 2. 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 Electronically Signed   By: Titus Dubin M.D.   On: 12/31/2017 19:14    Impression/Plan: -Symptomatic anemia with hemoglobin of 5.9 on admission.  Status post blood transfusion. -Dark stools for last 3 to 4 days.  FOBT positive. -Peripheral vascular disease.  Plavix on hold.  Last dose 11/01  Recommendations ------------------------- -Start Protonix 40 mg twice daily. -EGD tomorrow. -Monitor H&H. -Okay to have a full liquid diet today.  N.p.o. past midnight.  Risks (bleeding, infection, bowel perforation that could require surgery, sedation-related changes in cardiopulmonary systems), benefits (identification and possible treatment of source of symptoms, exclusion of certain causes of symptoms), and alternatives (watchful waiting, radiographic imaging studies, empiric medical treatment)  were explained to patient in detail and patient wishes to proceed.   LOS: 1 day   Otis Brace  MD, FACP 01/01/2018, 8:55 AM  Contact #  [phone]

## 2018-01-02 ENCOUNTER — Inpatient Hospital Stay (HOSPITAL_COMMUNITY): Payer: Medicare Other | Admitting: Certified Registered Nurse Anesthetist

## 2018-01-02 ENCOUNTER — Encounter (HOSPITAL_COMMUNITY): Admission: EM | Disposition: A | Discharge status: Home / Self Care | Payer: Self-pay | Source: Home / Self Care

## 2018-01-02 ENCOUNTER — Encounter (HOSPITAL_COMMUNITY): Payer: Self-pay | Admitting: *Deleted

## 2018-01-02 DIAGNOSIS — J441 Chronic obstructive pulmonary disease with (acute) exacerbation: Secondary | ICD-10-CM

## 2018-01-02 HISTORY — PX: ESOPHAGOGASTRODUODENOSCOPY (EGD) WITH PROPOFOL: SHX5813

## 2018-01-02 LAB — BASIC METABOLIC PANEL
ANION GAP: 7 (ref 5–15)
BUN: 13 mg/dL (ref 8–23)
CO2: 20 mmol/L — AB (ref 22–32)
Calcium: 9.3 mg/dL (ref 8.9–10.3)
Chloride: 109 mmol/L (ref 98–111)
Creatinine, Ser: 1.57 mg/dL — ABNORMAL HIGH (ref 0.61–1.24)
GFR calc non Af Amer: 39 mL/min — ABNORMAL LOW (ref >60)
GFR, EST AFRICAN AMERICAN: 46 mL/min — AB (ref >60)
Glucose, Bld: 100 mg/dL — ABNORMAL HIGH (ref 70–99)
Potassium: 3.9 mmol/L (ref 3.5–5.1)
SODIUM: 136 mmol/L (ref 135–145)

## 2018-01-02 LAB — GLUCOSE, CAPILLARY
GLUCOSE-CAPILLARY: 81 mg/dL (ref 70–99)
GLUCOSE-CAPILLARY: 83 mg/dL (ref 70–99)
GLUCOSE-CAPILLARY: 89 mg/dL (ref 70–99)
Glucose-Capillary: 82 mg/dL (ref 70–99)
Glucose-Capillary: 108 mg/dL — ABNORMAL HIGH (ref 70–99)
Glucose-Capillary: 115 mg/dL — ABNORMAL HIGH (ref 70–99)

## 2018-01-02 LAB — CBC
HEMATOCRIT: 31.8 % — AB (ref 39.0–52.0)
HEMOGLOBIN: 8.8 g/dL — AB (ref 13.0–17.0)
MCH: 19.3 pg — ABNORMAL LOW (ref 26.0–34.0)
MCHC: 27.7 g/dL — ABNORMAL LOW (ref 30.0–36.0)
MCV: 69.7 fL — ABNORMAL LOW (ref 80.0–100.0)
NRBC: 0 % (ref 0.0–0.2)
Platelets: 321 10*3/uL (ref 150–400)
RBC: 4.56 MIL/uL (ref 4.22–5.81)
RDW: 20.2 % — AB (ref 11.5–15.5)
WBC: 7.1 10*3/uL (ref 4.0–10.5)

## 2018-01-02 SURGERY — ESOPHAGOGASTRODUODENOSCOPY (EGD) WITH PROPOFOL
Anesthesia: Monitor Anesthesia Care

## 2018-01-02 MED ORDER — PANTOPRAZOLE SODIUM 40 MG PO TBEC
40.0000 mg | DELAYED_RELEASE_TABLET | Freq: Every day | ORAL | Status: DC
  Administered 2018-01-02 – 2018-01-03 (×2): 40 mg via ORAL
  Filled 2018-01-02 (×2): qty 1

## 2018-01-02 MED ORDER — SODIUM CHLORIDE 0.9 % IV SOLN: INTRAVENOUS | Status: DC

## 2018-01-02 MED ORDER — HYDRALAZINE HCL 20 MG/ML IJ SOLN: 5.0000 mg | Freq: Four times a day (QID) | INTRAMUSCULAR | Status: DC | PRN

## 2018-01-02 MED ORDER — SODIUM CHLORIDE 0.9 % IV SOLN
INTRAVENOUS | Status: DC
  Administered 2018-01-02: 12:00:00 via INTRAVENOUS

## 2018-01-02 MED ORDER — PEG 3350-KCL-NABCB-NACL-NASULF 236 G PO SOLR
2000.0000 mL | Freq: Once | ORAL | Status: AC
  Administered 2018-01-02: 2000 mL via ORAL
  Filled 2018-01-02: qty 4000

## 2018-01-02 MED ORDER — PROPOFOL 500 MG/50ML IV EMUL
INTRAVENOUS | Status: DC | PRN
  Administered 2018-01-02: 75 ug/kg/min via INTRAVENOUS

## 2018-01-02 MED ORDER — LACTATED RINGERS IV SOLN
INTRAVENOUS | Status: AC | PRN
  Administered 2018-01-02: 1000 mL via INTRAVENOUS

## 2018-01-02 MED ORDER — PROPOFOL 10 MG/ML IV BOLUS
INTRAVENOUS | Status: DC | PRN
  Administered 2018-01-02: 25 mg via INTRAVENOUS
  Administered 2018-01-02: 20 mg via INTRAVENOUS

## 2018-01-02 MED ORDER — PEG 3350-KCL-NABCB-NACL-NASULF 236 G PO SOLR
1000.0000 mL | Freq: Once | ORAL | Status: AC
  Administered 2018-01-03: 2000 mL via ORAL
  Filled 2018-01-02: qty 4000

## 2018-01-02 MED ORDER — AMLODIPINE BESYLATE 10 MG PO TABS
10.0000 mg | ORAL_TABLET | Freq: Every day | ORAL | Status: DC
  Administered 2018-01-02 – 2018-01-03 (×2): 10 mg via ORAL
  Filled 2018-01-02 (×2): qty 1

## 2018-01-02 MED ORDER — PEG 3350-KCL-NA BICARB-NACL 420 G PO SOLR: 4000.0000 mL | Freq: Once | ORAL | Status: DC

## 2018-01-02 SURGICAL SUPPLY — 15 items

## 2018-01-02 NOTE — Interval H&P Note (Signed)
History and Physical Interval Note: 84/male on plavix(on hold for 3 days) and ASA 81 mg presented with Hb of 5.9 and dark stools, microcytic anemia and has received 2 units PRBC transfusion.  01/02/2018 8:41 AM  Philip Gray  has presented today for EGD, with the diagnosis of Symptomatic anemia, occult blood positive  The various methods of treatment have been discussed with the patient and family. After consideration of risks, benefits and other options for treatment, the patient has consented to  Procedure(s): ESOPHAGOGASTRODUODENOSCOPY (EGD) WITH PROPOFOL (N/A) as a surgical intervention .  The patient's history has been reviewed, patient examined, no change in status, stable for surgery.  I have reviewed the patient's chart and labs.  Questions were answered to the patient's satisfaction.     Ronnette Juniper

## 2018-01-02 NOTE — Op Note (Signed)
EGD was performed for anemia, hemoglobin 5.9 on presentation, 2 units PRBC transfusion, dark stools and occult blood positive. Patient was on aspirin 81 mg and Plavix 75 mg until 12/30/17.  Findings: Normal esophagus, irregular Z line at 37 cm. Small, diminutive, 3 mm nonbleeding superficial ulcers in the antrum. Normal cardia and fundus and retroflexion. Normal duodenal bulb and rest of the duodenum.   Recommendations: Clear liquid diet. Colonic prep. Colonoscopy in a.m.Marland Kitchen   Ronnette Juniper, M.D.

## 2018-01-02 NOTE — Anesthesia Postprocedure Evaluation (Signed)
Anesthesia Post Note  Patient: Philip Gray  Procedure(s) Performed: ESOPHAGOGASTRODUODENOSCOPY (EGD) WITH PROPOFOL (N/A )     Patient location during evaluation: Endoscopy Anesthesia Type: MAC Level of consciousness: awake and alert Pain management: pain level controlled Vital Signs Assessment: post-procedure vital signs reviewed and stable Respiratory status: spontaneous breathing, nonlabored ventilation, respiratory function stable and patient connected to nasal cannula oxygen Cardiovascular status: stable and blood pressure returned to baseline Postop Assessment: no apparent nausea or vomiting Anesthetic complications: no    Last Vitals:  Vitals:   01/02/18 0919 01/02/18 0930  BP: (!) 149/58 (!) 145/64  Pulse: 65 63  Resp: 15 (!) 24  Temp: 36.5 C   SpO2: 100% 97%    Last Pain:  Vitals:   01/02/18 0930  TempSrc:   PainSc: 0-No pain                 Jatavis Malek L Chantz Montefusco

## 2018-01-02 NOTE — Brief Op Note (Signed)
12/31/2017 - 01/02/2018  9:20 AM  PATIENT:  Philip Gray  84 y.o. male  PRE-OPERATIVE DIAGNOSIS:  Symptomatic anemia, occult blood positive  POST-OPERATIVE DIAGNOSIS:  superficial gastric ulcers, no active bleeding  PROCEDURE:  Procedure(s): ESOPHAGOGASTRODUODENOSCOPY (EGD) WITH PROPOFOL (N/A)  SURGEON:  Surgeon(s) and Role:    Ronnette Juniper, MD - Primary  PHYSICIAN ASSISTANT:   ASSISTANTS:, Kingsley Plan, RN, Cletis Athens, Tech  ANESTHESIA:   MAC  EBL:  None  BLOOD ADMINISTERED:none  DRAINS: none   LOCAL MEDICATIONS USED:  NONE  SPECIMEN:  No Specimen  DISPOSITION OF SPECIMEN:  N/A  COUNTS:  YES  TOURNIQUET:  * No tourniquets in log *  DICTATION: .Dragon Dictation  PLAN OF CARE: Admit to inpatient   PATIENT DISPOSITION:  PACU - hemodynamically stable.   Delay start of Pharmacological VTE agent (>24hrs) due to surgical blood loss or risk of bleeding: yes

## 2018-01-02 NOTE — Op Note (Signed)
[facility] Patient Name: Philip Gray Procedure Date : 01/02/2018 MRN: [id number] Attending MD: Ronnette Juniper , MD Date of Birth: 1933/12/20 CSN: [id number] Age: 84 Admit Type: Inpatient Procedure:                Upper GI endoscopy Indications:              Unexplained iron deficiency anemia requiring 2                            units PRBC transfusion, Hb was 5.9 on presentation,                            dark stools, Occult blood in stool Providers:                Ronnette Juniper, MD, Carlyn Reichert, RN, Cletis Athens,                            Technician Referring MD:              Medicines:                Monitored Anesthesia Care Complications:            No immediate complications. Estimated blood loss:                            None. Estimated Blood Loss:     Estimated blood loss: none. Procedure:                Pre-Anesthesia Assessment:                           - Prior to the procedure, a History and Physical                            was performed, and patient medications and                            allergies were reviewed. The patient's tolerance of                            previous anesthesia was also reviewed. The risks                            and benefits of the procedure and the sedation                            options and risks were discussed with the patient.                            All questions were answered, and informed consent                            was obtained. Prior Anticoagulants: The patient has  taken Plavix (clopidogrel), last dose was 3 days                            prior to procedure. ASA Grade Assessment: III - A                            patient with severe systemic disease. After                            reviewing the risks and benefits, the patient was                            deemed in satisfactory condition to undergo the                            procedure.  After obtaining informed consent, the endoscope was                            passed under direct vision. Throughout the                            procedure, the patient's blood pressure, pulse, and                            oxygen saturations were monitored continuously. The                            GIF-H190 ([id number]) Olympus adult EGD was introduced                            through the mouth, and advanced to the second part                            of duodenum. The upper GI endoscopy was                            accomplished without difficulty. The patient                            tolerated the procedure well. Scope In: Scope Out: Findings:      The examined esophagus was normal.      The Z-line was regular and was found 37 cm from the incisors.      Three non-bleeding superficial gastric ulcers with a clean ulcer base       (Forrest Class III) were found in the gastric antrum. The largest lesion       was 3 mm in largest dimension.      The cardia and gastric fundus were normal on retroflexion.      The examined duodenum was normal. Impression:               - Normal esophagus.                           - Z-line regular, 37 cm from  the incisors.                           - Non-bleeding gastric ulcers with a clean ulcer                            base (Forrest Class III).                           - Normal examined duodenum.                           - No specimens collected. Moderate Sedation:      Patient did not receive moderate sedation for this procedure, but       instead received monitored anesthesia care. Recommendation:           - Clear liquid diet.                           - Perform a colonoscopy tomorrow. Procedure Code(s):        --- Professional ---                           [phone], Esophagogastroduodenoscopy, flexible,                            transoral; diagnostic, including collection of                            specimen(s) by brushing or washing,  when performed                            (separate procedure) Diagnosis Code(s):        --- Professional ---                           K25.9, Gastric ulcer, unspecified as acute or                            chronic, without hemorrhage or perforation                           D50.9, Iron deficiency anemia, unspecified                           R19.5, Other fecal abnormalities CPT copyright 2018 [employer]. All rights reserved. The codes documented in this report are preliminary and upon coder review may  be revised to meet current compliance requirements. Ronnette Juniper, MD 01/02/2018 9:20:23 AM This report has been signed electronically. Number of Addenda: 0

## 2018-01-02 NOTE — Anesthesia Preprocedure Evaluation (Addendum)
Anesthesia Evaluation  Patient identified by MRN, date of birth, ID band Patient awake    Reviewed: Allergy & Precautions, NPO status , Patient's Chart, lab work & pertinent test results  Airway Mallampati: II  TM Distance: >3 FB Neck ROM: Full    Dental no notable dental hx. (+) Edentulous Upper, Edentulous Lower   Pulmonary COPD, Current Smoker,    Pulmonary exam normal breath sounds clear to auscultation       Cardiovascular hypertension, Pt. on medications + Peripheral Vascular Disease (AAA)  Normal cardiovascular exam Rhythm:Regular Rate:Normal     Neuro/Psych negative neurological ROS  negative psych ROS   GI/Hepatic negative GI ROS, Neg liver ROS,   Endo/Other  negative endocrine ROSdiabetes, Type 2, Oral Hypoglycemic Agents  Renal/GU negative Renal ROS  negative genitourinary   Musculoskeletal negative musculoskeletal ROS (+)   Abdominal   Peds  Hematology  (+) Blood dyscrasia (Hgb 8.1), anemia ,   Anesthesia Other Findings Anemia, positive occult blood  On plavix, last dose Friday (11/1) night  Reproductive/Obstetrics                            Anesthesia Physical Anesthesia Plan  ASA: III  Anesthesia Plan: MAC   Post-op Pain Management:    Induction: Intravenous  PONV Risk Score and Plan: 1 and Propofol infusion and Treatment may vary due to age or medical condition  Airway Management Planned: Natural Airway and Simple Face Mask  Additional Equipment:   Intra-op Plan:   Post-operative Plan:   Informed Consent: I have reviewed the patients History and Physical, chart, labs and discussed the procedure including the risks, benefits and alternatives for the proposed anesthesia with the patient or authorized representative who has indicated his/her understanding and acceptance.   Dental advisory given  Plan Discussed with: CRNA  Anesthesia Plan Comments:        Anesthesia Quick Evaluation

## 2018-01-02 NOTE — Transfer of Care (Signed)
Immediate Anesthesia Transfer of Care Note  Patient: Philip Gray  Procedure(s) Performed: ESOPHAGOGASTRODUODENOSCOPY (EGD) WITH PROPOFOL (N/A )  Patient Location: Endoscopy suite  Anesthesia Type:MAC  Level of Consciousness: awake, alert , oriented and patient cooperative  Airway & Oxygen Therapy: Patient Spontanous Breathing and Patient connected to nasal cannula oxygen  Post-op Assessment: Report given to RN and Post -op Vital signs reviewed and stable  Post vital signs: Reviewed and stable  Last Vitals:  Vitals Value Taken Time  BP 149/58 01/02/2018  9:17 AM  Temp    Pulse 65 01/02/2018  9:19 AM  Resp 15 01/02/2018  9:19 AM  SpO2 100 % 01/02/2018  9:19 AM  Vitals shown include unvalidated device data.  Last Pain:  Vitals:   01/02/18 0829  TempSrc: Oral  PainSc: 0-No pain      Patients Stated Pain Goal: 0 (81/15/72 6203)  Complications: No apparent anesthesia complications

## 2018-01-02 NOTE — Progress Notes (Signed)
Family Medicine Teaching Service Daily Progress Note Intern Pager: [phone]  Patient name: Philip Gray Medical record number: [id number] Date of birth: 1933-03-09 Age: 84 y.o. Gender: male  Primary Care Provider: Helane Rima, MD Consultants: GI Code Status: Full  Pt Overview and Major Events to Date:  11/2-admitted for symptomatic anemia  Philip Gray a 84 y.o.malepresenting with symptomatic anemia. PMH is significant forAAA, tobacco use, glaucoma, HTN, DM2, PVD, COPD, CKD 3.  Fatigue, resolved: most likely secondary to symptomatic anemia d/t GI source with positive FOBT, reports of dark stools x3-4 months. Was on aspirin and Plavix but no other blood thinners. No cardiac/CVA history, making transfusion threshold 7.0. Hgb improved from 5.9 on admission to 8.1 s/p transfusion 2U PRBCs, continued to increase to 8.8 on 11/4. EGD performed 11/4 showed "3 non-bleeding superficial gastric ulcers with a clean ulcer base (Forrest Class III) in the gastric antrum. The largest lesion was 3 mm in largest dimension." -GI consulted - appreciate recommendations -Monitor CBC -Colonoscopy 11/5  COPD, stable, improved: S/p Solu-Medrol by ED for SOB, denies fever, cough. No home inhalers. Lungs CTAB on exam. -DuoNebs q 6hrs as needed  Right foot pain, resolved: States he has had a couple of days of medial right ankle pain. No history of trauma to this, not sure if he could have twisted it.  X-ray 11/2 without acute fracture or dislocation.  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  Soft tissues unremarkable.We will obtain x-rays. -Tylenol as needed  EZM:OQHUT Plavix and aspirin at home for peripheral vascular disease. Denies history of MI.  - Hold Plavix and aspirin, GI agrees  Hypertension:On losartan 100 mg, Norvasc 2.5. Blood pressure 165/62 (11/4).  -Hold losartan d/t AKI -Restart amlodipine at 10mg  -Hydralazine 10 PRN SBP >180.  CKD 3: Most  recent creatinine as an outpatient 1.3 with GFR in the 50s. Currently slightly elevated from baseline at 1.57 (11/4), maybe d/t NPO/dehydration?. -Avoid nephrotoxic medications   Diabetes: Last A1c 6.7 on 08/23/2017. On metformin at home. Most recent Blood Sugar is 95.  -Hold metformin -Discontinue SSI.   Hyperlipidemia: On atorvastatin at home may benefit from discussion with PCP regarding benefits of this at his age. -Continue atorvastatin  FEN/GI:Regular diet now, n.p.o. after midnight Prophylaxis:SCDs  Disposition:Admit to MedSurg  Subjective:  Patient is seen this afternoon after having his EGD.  He reports feeling well as he ambulates his room.  He denies nausea, vomiting, and dark stools.  He is aware of the results of the EGD and knows he has a colonoscopy tomorrow 11/5.  Objective: Temp:  [97.9 F (36.6 C)-98.4 F (36.9 C)] 98.4 F (36.9 C) (11/04 0735) Pulse Rate:  [67-84] 67 (11/04 0735) Resp:  [18] 18 (11/04 0735) BP: (141-165)/(60-69) 165/62 (11/04 0735) SpO2:  [98 %-100 %] 100 % (11/04 0735)  Physical Exam  Constitutional: He appears well-developed and well-nourished.  HENT:  Head: Normocephalic.  Eyes: EOM are normal.  Cardiovascular: Normal rate and normal heart sounds.  Pulmonary/Chest: Effort normal.  Abdominal: Soft. Bowel sounds are normal. He exhibits no distension. There is no tenderness.  Musculoskeletal: Normal range of motion.       Right lower leg: He exhibits no edema.       Left lower leg: He exhibits no edema.  Neurological: He is alert.  Skin: Skin is warm and dry. Capillary refill takes less than 2 seconds.  Psychiatric: He has a normal mood and affect. His behavior is normal.   Laboratory: Recent Labs  Lab 12/31/17 1416 01/01/18 0552 01/02/18 0657  WBC 7.7 6.8 7.1  HGB 5.9* 8.1* 8.8*  HCT 23.1* 27.8* 31.8*  PLT 376 289 321   Recent Labs  Lab 12/31/17 1416 01/01/18 0552 01/02/18 0657  NA 133* 135 136  K 3.8 3.8 3.9  CL  106 109 109  CO2 21* 20* 20*  BUN 16 13 13   CREATININE 1.39* 1.35* 1.57*  CALCIUM 8.9 8.8* 9.3  PROT 7.0  --   --   BILITOT 0.8  --   --   ALKPHOS 101  --   --   ALT 39  --   --   AST 34  --   --   GLUCOSE 165* 93 100*   Imaging/Diagnostic Tests: Dg Chest 2 View  Result Date: 12/31/2017 CLINICAL DATA:  Pt presents to ED for assessment of SOB x 1 Month, but worsening in the past few days. Exertional SOB. Patient reports coughing up phlegm HX COPD, smokerCOPD, worsening SOB, cough EXAM: CHEST - 2 VIEW COMPARISON:  Radiograph 12/09/2009 FINDINGS: Normal cardiac silhouette. There is bilateral fine interstitial pattern which is increased from comparison exam 2011. LEFT upper lobe nodule measuring 10 mm is not changed from radiograph 2011. No infiltrate. No pneumothorax. No acute osseous abnormality. IMPRESSION: 1. Bilateral interstitial pattern is favored chronic interstitial lung disease. Cannot exclude mild superimposed interstitial edema. 2. No change in LEFT upper lobe nodule over multiple years consistent benign etiology. Electronically Signed   By: Suzy Bouchard M.D.   On: 12/31/2017 15:07   Dg Ankle 2 Views Right  Result Date: 12/31/2017 CLINICAL DATA:  Right ankle and foot pain.  No trauma. EXAM: RIGHT FOOT COMPLETE - 3+ VIEW; RIGHT ANKLE - 2 VIEW COMPARISON:  None. FINDINGS: No acute fracture or dislocation. The ankle mortise is symmetric. The talar dome is intact. 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 Remaining joint spaces are preserved. Osteopenia. Soft tissues are unremarkable. IMPRESSION: 1. No acute osseous abnormality of the right ankle or foot. 2. 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 Electronically Signed   By: Titus Dubin M.D.   On: 12/31/2017 19:14   Dg Foot Complete Right  Result Date: 12/31/2017 CLINICAL DATA:  Right ankle and foot pain.  No trauma. EXAM: RIGHT FOOT COMPLETE - 3+ VIEW; RIGHT ANKLE - 2 VIEW COMPARISON:  None. FINDINGS: No acute  fracture or dislocation. The ankle mortise is symmetric. The talar dome is intact. 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 Remaining joint spaces are preserved. Osteopenia. Soft tissues are unremarkable. IMPRESSION: 1. No acute osseous abnormality of the right ankle or foot. 2. 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 Electronically Signed   By: Titus Dubin M.D.   On: 12/31/2017 19:14    Daisy Floro, DO 01/02/2018, 8:19 AM PGY-1, [place] Intern pager: [phone], text pages welcome

## 2018-01-02 NOTE — Anesthesia Procedure Notes (Signed)
Procedure Name: MAC Date/Time: 01/02/2018 9:06 AM Performed by: Colin Benton, CRNA Pre-anesthesia Checklist: Patient identified, Emergency Drugs available, Suction available and Patient being monitored Patient Re-evaluated:Patient Re-evaluated prior to induction Oxygen Delivery Method: Nasal cannula Induction Type: IV induction Placement Confirmation: positive ETCO2 Dental Injury: Teeth and Oropharynx as per pre-operative assessment

## 2018-01-03 ENCOUNTER — Inpatient Hospital Stay (HOSPITAL_COMMUNITY): Payer: Medicare Other | Admitting: Certified Registered Nurse Anesthetist

## 2018-01-03 ENCOUNTER — Encounter (HOSPITAL_COMMUNITY): Admission: EM | Disposition: A | Discharge status: Home / Self Care | Payer: Self-pay | Source: Home / Self Care

## 2018-01-03 ENCOUNTER — Encounter (HOSPITAL_COMMUNITY): Payer: Self-pay | Admitting: *Deleted

## 2018-01-03 HISTORY — PX: POLYPECTOMY: SHX5525

## 2018-01-03 HISTORY — PX: COLONOSCOPY WITH PROPOFOL: SHX5780

## 2018-01-03 HISTORY — PX: BIOPSY: SHX5522

## 2018-01-03 LAB — CBC
HCT: 31.7 % — ABNORMAL LOW (ref 39.0–52.0)
Hemoglobin: 8.9 g/dL — ABNORMAL LOW (ref 13.0–17.0)
MCH: 19.8 pg — ABNORMAL LOW (ref 26.0–34.0)
MCHC: 28.1 g/dL — ABNORMAL LOW (ref 30.0–36.0)
MCV: 70.6 fL — AB (ref 80.0–100.0)
NRBC: 0 % (ref 0.0–0.2)
PLATELETS: 307 10*3/uL (ref 150–400)
RBC: 4.49 MIL/uL (ref 4.22–5.81)
RDW: 20.9 % — ABNORMAL HIGH (ref 11.5–15.5)
WBC: 8.2 10*3/uL (ref 4.0–10.5)

## 2018-01-03 LAB — GLUCOSE, CAPILLARY
GLUCOSE-CAPILLARY: 83 mg/dL (ref 70–99)
Glucose-Capillary: 76 mg/dL (ref 70–99)
Glucose-Capillary: 78 mg/dL (ref 70–99)
Glucose-Capillary: 84 mg/dL (ref 70–99)

## 2018-01-03 LAB — BASIC METABOLIC PANEL
ANION GAP: 8 (ref 5–15)
BUN: 11 mg/dL (ref 8–23)
CALCIUM: 9.2 mg/dL (ref 8.9–10.3)
CO2: 23 mmol/L (ref 22–32)
Chloride: 105 mmol/L (ref 98–111)
Creatinine, Ser: 1.36 mg/dL — ABNORMAL HIGH (ref 0.61–1.24)
GFR calc Af Amer: 54 mL/min — ABNORMAL LOW (ref >60)
GFR, EST NON AFRICAN AMERICAN: 47 mL/min — AB (ref >60)
Glucose, Bld: 95 mg/dL (ref 70–99)
POTASSIUM: 3.5 mmol/L (ref 3.5–5.1)
Sodium: 136 mmol/L (ref 135–145)

## 2018-01-03 SURGERY — COLONOSCOPY WITH PROPOFOL
Anesthesia: Monitor Anesthesia Care

## 2018-01-03 MED ORDER — PROPOFOL 10 MG/ML IV BOLUS
INTRAVENOUS | Status: DC | PRN
  Administered 2018-01-03: 10 mg via INTRAVENOUS
  Administered 2018-01-03 (×2): 20 mg via INTRAVENOUS
  Administered 2018-01-03: 10 mg via INTRAVENOUS
  Administered 2018-01-03: 20 mg via INTRAVENOUS

## 2018-01-03 MED ORDER — LACTATED RINGERS IV SOLN
INTRAVENOUS | Status: DC | PRN
  Administered 2018-01-03: 08:00:00 via INTRAVENOUS

## 2018-01-03 MED ORDER — ONDANSETRON HCL 4 MG/2ML IJ SOLN
INTRAMUSCULAR | Status: DC | PRN
  Administered 2018-01-03: 4 mg via INTRAVENOUS

## 2018-01-03 MED ORDER — PROPOFOL 500 MG/50ML IV EMUL
INTRAVENOUS | Status: DC | PRN
  Administered 2018-01-03: 120 ug/kg/min via INTRAVENOUS

## 2018-01-03 SURGICAL SUPPLY — 22 items

## 2018-01-03 NOTE — Anesthesia Preprocedure Evaluation (Signed)
Anesthesia Evaluation  Patient identified by MRN, date of birth, ID band Patient awake    Reviewed: Allergy & Precautions, NPO status , Patient's Chart, lab work & pertinent test results  Airway Mallampati: II  TM Distance: >3 FB Neck ROM: Full    Dental no notable dental hx. (+) Edentulous Upper, Edentulous Lower   Pulmonary COPD, Current Smoker,    Pulmonary exam normal breath sounds clear to auscultation       Cardiovascular negative cardio ROS Normal cardiovascular exam Rhythm:Regular Rate:Normal     Neuro/Psych negative neurological ROS  negative psych ROS   GI/Hepatic negative GI ROS, Neg liver ROS,   Endo/Other  diabetes  Renal/GU negative Renal ROS  negative genitourinary   Musculoskeletal negative musculoskeletal ROS (+)   Abdominal   Peds negative pediatric ROS (+)  Hematology negative hematology ROS (+) anemia ,   Anesthesia Other Findings   Reproductive/Obstetrics negative OB ROS                            Anesthesia Physical Anesthesia Plan  ASA: III  Anesthesia Plan: MAC   Post-op Pain Management:    Induction:   PONV Risk Score and Plan: 0 and Treatment may vary due to age or medical condition  Airway Management Planned: Simple Face Mask  Additional Equipment:   Intra-op Plan:   Post-operative Plan:   Informed Consent: I have reviewed the patients History and Physical, chart, labs and discussed the procedure including the risks, benefits and alternatives for the proposed anesthesia with the patient or authorized representative who has indicated his/her understanding and acceptance.   Dental advisory given  Plan Discussed with:   Anesthesia Plan Comments:         Anesthesia Quick Evaluation

## 2018-01-03 NOTE — Discharge Instructions (Signed)
1.  Stop losartan, aspirin, and metformin until you follow-up with your primary care physician, Dr. Lavone Neri. 2.  Your blood level (hemoglobin) rose from 5.9-8.9.  Please follow-up with this when you see your primary care physician. 3.  We have been watching your kidney function while you are in the hospital and your creatinine most recently decreased from 1.57-1.36.  This is a good change in the level of creatinine for your kidney function.  Please follow-up with this with your primary care physician. 4.  The patient may continue Plavix 75 mg on January 04, 2018.

## 2018-01-03 NOTE — Interval H&P Note (Signed)
History and Physical Interval Note: 84/male with anemia, FOBT positive stools, small antral ulcers-otherwise unremarkable EGD for a diagnostic colonoscopy today.  01/03/2018 8:39 AM  Sandra Cockayne  has presented today for Colonoscopy, with the diagnosis of Anemia, FOBT positive stool  The various methods of treatment have been discussed with the patient and family. After consideration of risks, benefits and other options for treatment, the patient has consented to  Procedure(s): COLONOSCOPY WITH PROPOFOL (N/A) as a surgical intervention .  The patient's history has been reviewed, patient examined, no change in status, stable for surgery.  I have reviewed the patient's chart and labs.  Questions were answered to the patient's satisfaction.     Ronnette Juniper

## 2018-01-03 NOTE — Brief Op Note (Signed)
12/31/2017 - 01/03/2018  9:27 AM  PATIENT:  Philip Gray  84 y.o. male  PRE-OPERATIVE DIAGNOSIS:  Anemia, FOBT positive stool  POST-OPERATIVE DIAGNOSIS:  diverticulosis, transverse colon polyp removed biopsy forcep, abnormal sigmoid mucosa biopsied  PROCEDURE:  Procedure(s): COLONOSCOPY WITH PROPOFOL (N/A) POLYPECTOMY BIOPSY  SURGEON:  Surgeon(s) and Role:    Ronnette Juniper, MD - Primary  PHYSICIAN ASSISTANT:   ASSISTANTS:Jennifer Kappus, RN, Tinnie Gens, Tech  ANESTHESIA:   MAC  EBL:  Minimal  BLOOD ADMINISTERED:none  DRAINS: none   LOCAL MEDICATIONS USED:  NONE  SPECIMEN:  Biopsy / Limited Resection  DISPOSITION OF SPECIMEN:  PATHOLOGY  COUNTS:  YES  TOURNIQUET:  * No tourniquets in log *  DICTATION: .Dragon Dictation  PLAN OF CARE: Admit to inpatient   PATIENT DISPOSITION:  PACU - hemodynamically stable.   Delay start of Pharmacological VTE agent (>24hrs) due to surgical blood loss or risk of bleeding: yes

## 2018-01-03 NOTE — Progress Notes (Signed)
Family Medicine Teaching Service Daily Progress Note Intern Pager: [phone]  Patient name: Philip Gray Medical record number: [id number] Date of birth: August 26, 1933 Age: 84 y.o. Gender: male  Primary Care Provider: Helane Rima, MD Consultants:GI Code Status:Full  Pt Overview and Major Events to Date: 11/2 - admitted for symptomatic anemia 11/4 - EGD 11/5 - Colonoscopy  Philip Kingis a 84 y.o.malepresenting with symptomatic anemia. PMH is significant forAAA, tobacco use, glaucoma, HTN, DM2, PVD, COPD, CKD 3.  Anemia, improved, stable: most likely d/t GI source with positive FOBT, reports of dark stools x3-4 months.Was on aspirin and Plavix but no other blood thinners. No cardiac/CVA history, making transfusion threshold 7.0.Hgb 5.9 > 1U PRCB > 8.1 > 8.8 > 8.9. EGD performed 11/4 showed "3 non-bleeding superficial gastric ulcers with a clean ulcer base (Forrest Class III) in the gastric antrum. The largest lesion was 3 mm in largest dimension." Colonoscopy 11/5 showed "Non-bleeding internal hemorrhoids. Severe diverticulosis in the sigmoid colon and in the descending colon w/o evidence of diverticular bleeding. Biopsied. One 6 mm polyp in the transverse colon, removed piecemeal using a cold biopsy forceps. Resected and retrieved. Otherwise WNL" - GI consulted: resume regular diet, resume Plavix 11/6, refer to PCP for further adjustment of therapy, await pathology results - appreciate recommendations - Monitor CBC - Plan to follow up outpatient  COPD, stable, improved: s/p Solu-Medrol by ED for SOB, denies fever, cough. No home inhalers. Lungs CTAB on exam. -DuoNebs q 6hrs as needed  Right foot pain, resolved: States he has had a couple of days of medial right ankle pain. No history of trauma to this, not sure if he could have twisted it.X-ray 11/2without acute fracture or dislocation. 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 Soft tissues  unremarkable.We will obtain x-rays. -Tylenolas needed  GQQ:PYPPJ Plavix and aspirin at home for peripheral vascular disease. Denies history of MI -Can resume Plavix on 11/6 - Hold aspirin  Hypertension:On losartan 100 mg, Norvasc 2.5.Blood pressure165/62 (11/4) > 151/62 (11/5). -Hold losartan d/t AKI -Restart amlodipine at 10mg  -Hydralazine 10 PRN SBP >180.  CKD 3, improved, stable: Most recent creatinine as an outpatient 1.3 with GFR in the 50s. Currently slightly elevated from baseline at 1.57 (11/4) > 1.36 (11/5).  -Avoid nephrotoxic medications  -Monitor  Diabetes: Last A1c 6.7 on 08/23/2017. On metformin at home. Blood sugars between 76 - 115. -Hold metformin -Discontinue SSI until follow up with PCP.   Hyperlipidemia: On atorvastatin at home may benefit from discussion with PCP regarding benefits of this at his age. -Continue atorvastatin  FEN/GI:Clear liquid diet, colonic prep on 11/4 Prophylaxis:SCDs  Disposition:Admit to MedSurg  Subjective:  Patient seen sitting upright in bed eating peaches.  He says he is ready to go home and that he feels great.  He is aware of the results of the colonoscopy and knows to follow-up with his PCP regarding the results.  He denies dizziness, fatigue, nausea, vomiting, shortness of breath, cough, abdominal pain, constipation, and diarrhea.  Objective: Temp:  [97.7 F (36.5 C)-98.3 F (36.8 C)] 98.3 F (36.8 C) (11/05 0425) Pulse Rate:  [63-75] 69 (11/05 0425) Resp:  [15-24] 18 (11/05 0425) BP: (145-175)/(58-73) 151/62 (11/05 0425) SpO2:  [97 %-100 %] 98 % (11/05 0425) Weight:  [71.6 kg-71.7 kg] 71.6 kg (11/04 2035)  Physical Exam  Constitutional: He appears well-developed and well-nourished.  HENT:  Head: Normocephalic.  Eyes: EOM are normal.  Cardiovascular: Normal rate and normal heart sounds.  Pulmonary/Chest: Effort normal.  Abdominal: Soft. Bowel sounds are normal. He exhibits no distension. There is no  tenderness.  Musculoskeletal: Normal range of motion.       Right lower leg: He exhibits no edema.       Left lower leg: He exhibits no edema.  Neurological: He is alert.  Skin: Skin is warm and dry. Capillary refill takes less than 2 seconds.  Psychiatric: He has a normal mood and affect. His behavior is normal.   Laboratory: Recent Labs  Lab 12/31/17 1416 01/01/18 0552 01/02/18 0657  WBC 7.7 6.8 7.1  HGB 5.9* 8.1* 8.8*  HCT 23.1* 27.8* 31.8*  PLT 376 289 321   Recent Labs  Lab 12/31/17 1416 01/01/18 0552 01/02/18 0657  NA 133* 135 136  K 3.8 3.8 3.9  CL 106 109 109  CO2 21* 20* 20*  BUN 16 13 13   CREATININE 1.39* 1.35* 1.57*  CALCIUM 8.9 8.8* 9.3  PROT 7.0  --   --   BILITOT 0.8  --   --   ALKPHOS 101  --   --   ALT 39  --   --   AST 34  --   --   GLUCOSE 165* 93 100*   Imaging/Diagnostic Tests: Dg Chest 2 View  Result Date: 12/31/2017 CLINICAL DATA:  Pt presents to ED for assessment of SOB x 1 Month, but worsening in the past few days. Exertional SOB. Patient reports coughing up phlegm HX COPD, smokerCOPD, worsening SOB, cough EXAM: CHEST - 2 VIEW COMPARISON:  Radiograph 12/09/2009 FINDINGS: Normal cardiac silhouette. There is bilateral fine interstitial pattern which is increased from comparison exam 2011. LEFT upper lobe nodule measuring 10 mm is not changed from radiograph 2011. No infiltrate. No pneumothorax. No acute osseous abnormality. IMPRESSION: 1. Bilateral interstitial pattern is favored chronic interstitial lung disease. Cannot exclude mild superimposed interstitial edema. 2. No change in LEFT upper lobe nodule over multiple years consistent benign etiology. Electronically Signed   By: Suzy Bouchard M.D.   On: 12/31/2017 15:07   Dg Ankle 2 Views Right  Result Date: 12/31/2017 CLINICAL DATA:  Right ankle and foot pain.  No trauma. EXAM: RIGHT FOOT COMPLETE - 3+ VIEW; RIGHT ANKLE - 2 VIEW COMPARISON:  None. FINDINGS: No acute fracture or dislocation. The  ankle mortise is symmetric. The talar dome is intact. 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 Remaining joint spaces are preserved. Osteopenia. Soft tissues are unremarkable. IMPRESSION: 1. No acute osseous abnormality of the right ankle or foot. 2. 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 Electronically Signed   By: Titus Dubin M.D.   On: 12/31/2017 19:14   Dg Foot Complete Right  Result Date: 12/31/2017 CLINICAL DATA:  Right ankle and foot pain.  No trauma. EXAM: RIGHT FOOT COMPLETE - 3+ VIEW; RIGHT ANKLE - 2 VIEW COMPARISON:  None. FINDINGS: No acute fracture or dislocation. The ankle mortise is symmetric. The talar dome is intact. 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 Remaining joint spaces are preserved. Osteopenia. Soft tissues are unremarkable. IMPRESSION: 1. No acute osseous abnormality of the right ankle or foot. 2. 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 Electronically Signed   By: Titus Dubin M.D.   On: 12/31/2017 19:14    Daisy Floro, DO 01/03/2018, 7:50 AM PGY-1, [place] Intern pager: [phone], text pages welcome

## 2018-01-03 NOTE — Anesthesia Postprocedure Evaluation (Signed)
Anesthesia Post Note  Patient: Philip Gray  Procedure(s) Performed: COLONOSCOPY WITH PROPOFOL (N/A ) POLYPECTOMY BIOPSY     Patient location during evaluation: Endoscopy Anesthesia Type: MAC Level of consciousness: awake and alert Pain management: pain level controlled Vital Signs Assessment: post-procedure vital signs reviewed and stable Respiratory status: spontaneous breathing, nonlabored ventilation, respiratory function stable and patient connected to nasal cannula oxygen Cardiovascular status: stable and blood pressure returned to baseline Postop Assessment: no apparent nausea or vomiting Anesthetic complications: no    Last Vitals:  Vitals:   01/03/18 1000 01/03/18 1035  BP: (!) 178/53 (!) 160/58  Pulse: 62 63  Resp:  18  Temp:    SpO2: 95% 96%    Last Pain:  Vitals:   01/03/18 1125  TempSrc:   PainSc: 0-No pain                 Montez Hageman

## 2018-01-03 NOTE — Discharge Summary (Signed)
[facility] Discharge Summary  Patient name: Gray Philip Medical record number: [id number] Date of birth: [birth year] Age: 84 y.o. Gender: male Date of Admission: 12/31/2017  Date of Discharge: 01/03/2018 Admitting Physician: Kinnie Feil, MD  Primary Care Provider: Helane Rima, MD Consultants: GI  Indication for Hospitalization: Symptomatic anemia  Discharge Diagnoses/Problem List:  Hypertension Type 2 diabetes Peripheral vascular disease COPD CKD 3 Glaucoma Tobacco use Abdominal aortic aneurysm Diverticulosis  Disposition: Discharge home  Discharge Condition: Stable  Discharge Exam:  Physical Exam  Constitutional: He is well-developed, well-nourished, and in no distress.  HENT:  Head: Normocephalic.  Eyes: EOM are normal.  Neck: Normal range of motion.  Cardiovascular: Normal rate and regular rhythm.  Pulmonary/Chest: Effort normal and breath sounds normal.  Abdominal: Soft. Bowel sounds are normal. He exhibits no distension.  Musculoskeletal: Normal range of motion. He exhibits no edema.  Neurological: He is alert.  Skin: Skin is warm.  Psychiatric: Mood, memory, affect and judgment normal.   Brief Hospital Course:  Philip Gray is an 84 year old male who was admitted 12/31/2017 for symptomatic anemia with positive FOBT and initial hemoglobin of 5.9.  The patient's Plavix and aspirin were held throughout the hospital stay and he was transfused 2 units PRBCs and his hemoglobin increased to 8.1.  GI was consulted and recommended performing an EGD, which found 3 nonbleeding superficial gastric ulcers with clean ulcer base in the gastric antrum, the largest of which was 3 mm in dimension.  The next day, on 01/03/2018, a colonoscopy was performed and revealed nonbleeding internal hemorrhoids, severe diverticulosis in the sigmoid colon and descending colon without evidence of bleeding.  A 6 mm polyp in the transverse colon was also found on  colonoscopy and was removed.  No GI follow-up or repeat colonoscopy were recommended.  During the hospital stay, the patient's creatinine rose from baseline around 1.35-1.57.  His losartan and metformin were discontinued due to AKI, and amlodipine was initiated.  The patient's hemoglobin continued to increase to 8.9, his clinical picture improved, and vitals remained stable throughout hospitalization.  The patient was discharged to home on 01/03/2018.  Issues for Follow Up:  1.  Losartan and metformin were discontinued during hospitalization due to AKI.  Aspirin was discontinued due to GI bleed.  These medications may be restarted if found appropriate on follow-up visit.  Of note, the patient's most recent HbA1c was 6.7% and blood sugars were stable throughout hospital admission, challenging the need for metformin for diabetes management. 2.  The patient's hemoglobin proved from 5.9 to 8.9 after transfusion of 2 units PRBCs.  Consider rechecking hemoglobin on follow-up appointment. 3.  The patient developed an AKI with creatinine 1.57 during hospital stay. Creatinine improved on day of discharge to 1.36.  Please consider rechecking this value at follow-up appointment. 4.  Due to the patient's GI bleed, aspirin and Plavix were discontinued throughout the hospital stay. The patient may continue Plavix 75 mg on January 04, 2018.  Significant Procedures:  11/4 - EGD 11/5 - Colonoscopy with biopsy  Significant Labs and Imaging:  Recent Labs  Lab 01/01/18 0552 01/02/18 0657 01/03/18 1337  WBC 6.8 7.1 8.2  HGB 8.1* 8.8* 8.9*  HCT 27.8* 31.8* 31.7*  PLT 289 321 307   Recent Labs  Lab 12/31/17 1416 01/01/18 0552 01/02/18 0657 01/03/18 1337  NA 133* 135 136 136  K 3.8 3.8 3.9 3.5  CL 106 109 109 105  CO2 21* 20* 20* 23  GLUCOSE 165*  93 100* 95  BUN 16 13 13 11   CREATININE 1.39* 1.35* 1.57* 1.36*  CALCIUM 8.9 8.8* 9.3 9.2  ALKPHOS 101  --   --   --   AST 34  --   --   --   ALT 39  --   --    --   ALBUMIN 3.4*  --   --   --    Results/Tests Pending at Time of Discharge: None  Discharge Medications:  Allergies as of 01/03/2018   No Known Allergies     Medication List    STOP taking these medications   aspirin EC 81 MG tablet   losartan 100 MG tablet Commonly known as:  COZAAR   metFORMIN 500 MG tablet Commonly known as:  GLUCOPHAGE     TAKE these medications   amLODipine 2.5 MG tablet Commonly known as:  NORVASC Take 2.5 mg by mouth at bedtime.   atorvastatin 80 MG tablet Commonly known as:  LIPITOR Take 80 mg by mouth at bedtime.   clopidogrel 75 MG tablet Commonly known as:  PLAVIX Take 75 mg by mouth at bedtime.      Discharge Instructions:  1.  Stop losartan, aspirin, and metformin until you follow-up with your primary care physician, Dr. Lavone Neri. 2.  Your blood level (hemoglobin) rose from 5.9-8.9.  Please follow-up with this when you see your primary care physician. 3.  We have been watching your kidney function while you are in the hospital and your creatinine most recently decreased from 1.57-1.36.  This is a good change in the level of creatinine for your kidney function.  Please follow-up with this with your primary care physician. 4.  You may continue Plavix 75 mg on January 04, 2018.   Follow-Up Appointments: 12/26 - follow-up appointment with Dr. Milus Mallick, Betti Cruz, DO 01/03/2018, 3:03 PM PGY-1, [place]

## 2018-01-03 NOTE — Op Note (Signed)
Colonoscopy showed large internal hemorrhoids. Multiple medium mouth diverticula were found in the sigmoid and descending colon, there was one area covered with a lot of mucus and appeared inflamed in the sigmoid?focal diverticulitis, biopsies taken. One 6 mm polyp noted in transverse colon, removed via biopsy polypectomy. Otherwise unremarkable.   Recommendations: Regular diet. Pathology may be followed as an outpatient. Repeat colonoscopy not recommended due to age. Resume Plavix from tomorrow if needed. Okay to discharge from GI standpoint.  Ronnette Juniper, M.D.

## 2018-01-03 NOTE — Op Note (Signed)
[facility] Patient Name: Philip Gray Procedure Date : 01/03/2018 MRN: [id number] Attending MD: Ronnette Juniper , MD Date of Birth: 08-May-1933 CSN: [id number] Age: 84 Admit Type: Inpatient Procedure:                Colonoscopy Indications:              Last colonoscopy 10 years ago, Unexplained iron                            deficiency anemia Providers:                Ronnette Juniper, MD, Carlyn Reichert, RN, Tinnie Gens,                            Technician, Lance Coon, CRNA Referring MD:              Medicines:                Monitored Anesthesia Care Complications:            No immediate complications. Estimated blood loss:                            Minimal. Estimated Blood Loss:     Estimated blood loss was minimal. Procedure:                Pre-Anesthesia Assessment:                           - Prior to the procedure, a History and Physical                            was performed, and patient medications and                            allergies were reviewed. The patient's tolerance of                            previous anesthesia was also reviewed. The risks                            and benefits of the procedure and the sedation                            options and risks were discussed with the patient.                            All questions were answered, and informed consent                            was obtained. Prior Anticoagulants: The patient has                            taken Plavix (clopidogrel), last dose was 4 days                            prior  to procedure. ASA Grade Assessment: III - A                            patient with severe systemic disease. After                            reviewing the risks and benefits, the patient was                            deemed in satisfactory condition to undergo the                            procedure.                           After obtaining informed consent, the colonoscope    was passed under direct vision. Throughout the                            procedure, the patient's blood pressure, pulse, and                            oxygen saturations were monitored continuously. The                            Colonoscope was introduced through the anus and                            advanced to the the cecum, identified by                            appendiceal orifice and ileocecal valve. The                            colonoscopy was performed without difficulty. The                            patient tolerated the procedure well. The quality                            of the bowel preparation was adequate to identify                            polyps 6 mm and larger in size. Scope In: 8:52:01 AM Scope Out: 9:17:03 AM Scope Withdrawal Time: 0 hours 19 minutes 51 seconds  Total Procedure Duration: 0 hours 25 minutes 2 seconds  Findings:      The perianal and digital rectal examinations were normal.      Non-bleeding internal hemorrhoids were found during retroflexion. The       hemorrhoids were large.      Multiple medium-mouthed diverticula were found in the sigmoid colon and       descending colon. There was no evidence of diverticular bleeding. There       was one area with covered with a lot of mucous and appeared inflammed in  the signoid. Biopsies were taken with a cold forceps for histology.      A 6 mm polyp was found in the transverse colon. The polyp was sessile.       The polyp was removed with a piecemeal technique using a cold biopsy       forceps. Resection and retrieval were complete.      The exam was otherwise without abnormality. Impression:               - Non-bleeding internal hemorrhoids.                           - Severe diverticulosis in the sigmoid colon and in                            the descending colon. There was no evidence of                            diverticular bleeding. Biopsied.                           - One 6 mm  polyp in the transverse colon, removed                            piecemeal using a cold biopsy forceps. Resected and                            retrieved.                           - The examination was otherwise normal. Moderate Sedation:      Patient did not receive moderate sedation for this procedure, but       instead received monitored anesthesia care. Recommendation:           - Resume regular diet.                           - Resume Plavix (clopidogrel) at prior dose                            tomorrow. Refer to primary physician for further                            adjustment of therapy.                           - Await pathology results.                           - Repeat colonoscopy is not recommended due to                            current age. Procedure Code(s):        --- Professional ---                           [phone], Colonoscopy, flexible; with biopsy, single  or multiple Diagnosis Code(s):        --- Professional ---                           K64.8, Other hemorrhoids                           D12.3, Benign neoplasm of transverse colon (hepatic                            flexure or splenic flexure)                           D50.9, Iron deficiency anemia, unspecified                           K57.30, Diverticulosis of large intestine without                            perforation or abscess without bleeding CPT copyright 2018 [employer]. All rights reserved. The codes documented in this report are preliminary and upon coder review may  be revised to meet current compliance requirements. Ronnette Juniper, MD 01/03/2018 9:27:01 AM This report has been signed electronically. Number of Addenda: 0

## 2018-01-03 NOTE — Transfer of Care (Signed)
Immediate Anesthesia Transfer of Care Note  Patient: Philip Gray  Procedure(s) Performed: COLONOSCOPY WITH PROPOFOL (N/A ) POLYPECTOMY BIOPSY  Patient Location: PACU and Endoscopy Unit  Anesthesia Type:MAC  Level of Consciousness: drowsy  Airway & Oxygen Therapy: Patient Spontanous Breathing and Patient connected to face mask oxygen  Post-op Assessment: Report given to RN and Post -op Vital signs reviewed and stable  Post vital signs: Reviewed and stable  Last Vitals:  Vitals Value Taken Time  BP    Temp    Pulse    Resp    SpO2      Last Pain:  Vitals:   01/03/18 0821  TempSrc: Oral  PainSc: 0-No pain      Patients Stated Pain Goal: 0 (27/06/23 7628)  Complications: No apparent anesthesia complications

## 2018-01-03 NOTE — Progress Notes (Signed)
Philip Gray to be D/C'd Home per MD order.  Discussed prescriptions and follow up appointments with the patient. Prescriptions given to patient, medication list explained in detail. Pt verbalized understanding.  Allergies as of 01/03/2018   No Known Allergies     Medication List    STOP taking these medications   aspirin EC 81 MG tablet   losartan 100 MG tablet Commonly known as:  COZAAR   metFORMIN 500 MG tablet Commonly known as:  GLUCOPHAGE     TAKE these medications   amLODipine 2.5 MG tablet Commonly known as:  NORVASC Take 2.5 mg by mouth at bedtime.   atorvastatin 80 MG tablet Commonly known as:  LIPITOR Take 80 mg by mouth at bedtime.   clopidogrel 75 MG tablet Commonly known as:  PLAVIX Take 75 mg by mouth at bedtime.       Vitals:   01/03/18 1000 01/03/18 1035  BP: (!) 178/53 (!) 160/58  Pulse: 62 63  Resp:  18  Temp:    SpO2: 95% 96%    Skin clean, dry and intact without evidence of skin break down, no evidence of skin tears noted. IV catheter discontinued intact. Site without signs and symptoms of complications. Dressing and pressure applied. Pt denies pain at this time. No complaints noted.  An After Visit Summary was printed and given to the patient. Patient escorted by volunteer  via [place], and D/C home via private auto.

## 2018-01-04 ENCOUNTER — Encounter (HOSPITAL_COMMUNITY): Payer: Self-pay | Admitting: Gastroenterology

## 2018-12-13 IMAGING — DX DG FOOT COMPLETE 3+V*R*
2 series · 3 of 3 positions shown · non-contrast
Comparison: None.

CLINICAL DATA: Right ankle and foot pain.  No trauma.

EXAM:
RIGHT FOOT COMPLETE - 3+ VIEW; RIGHT ANKLE - 2 VIEW

[Series 1: foot · 0.14mm/px · 2 of 2 slices shown]
[im 1/2]
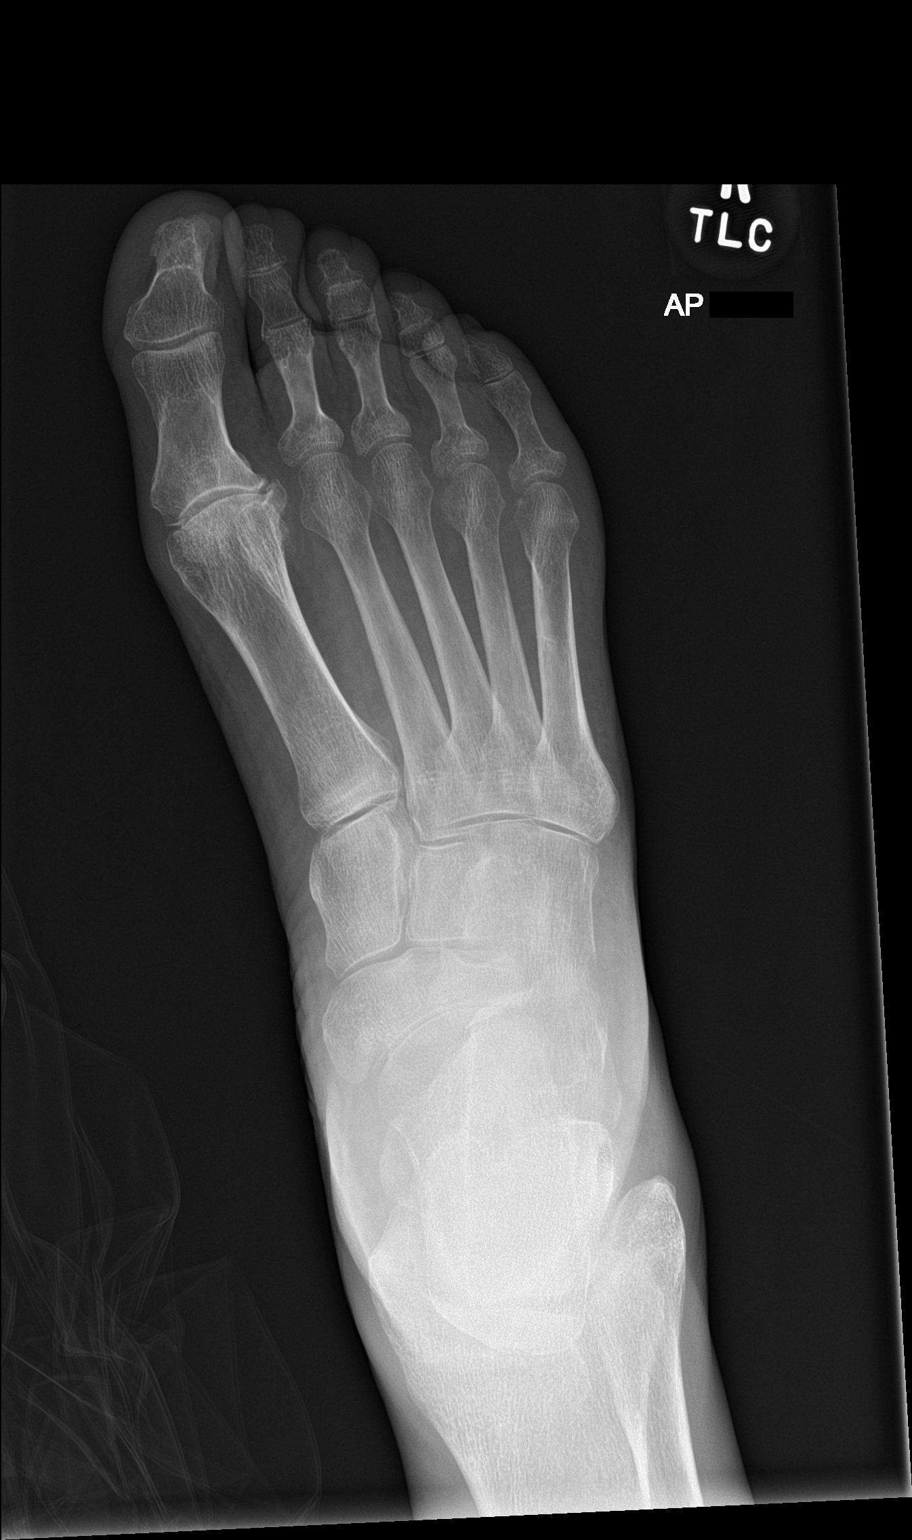
[im 2/2]
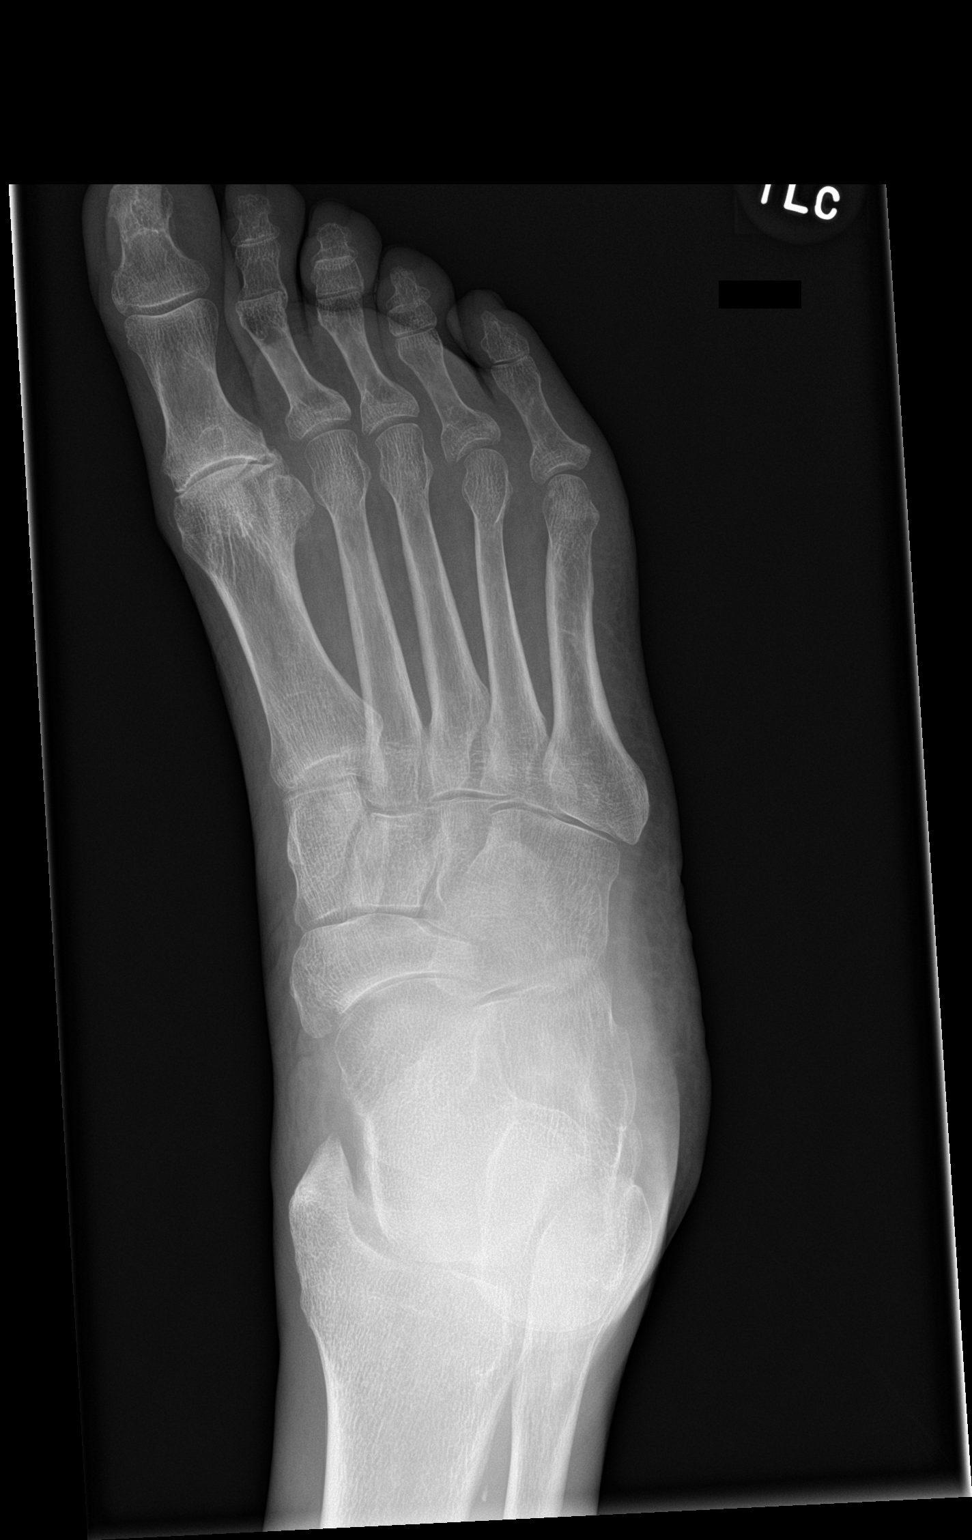

[leg]
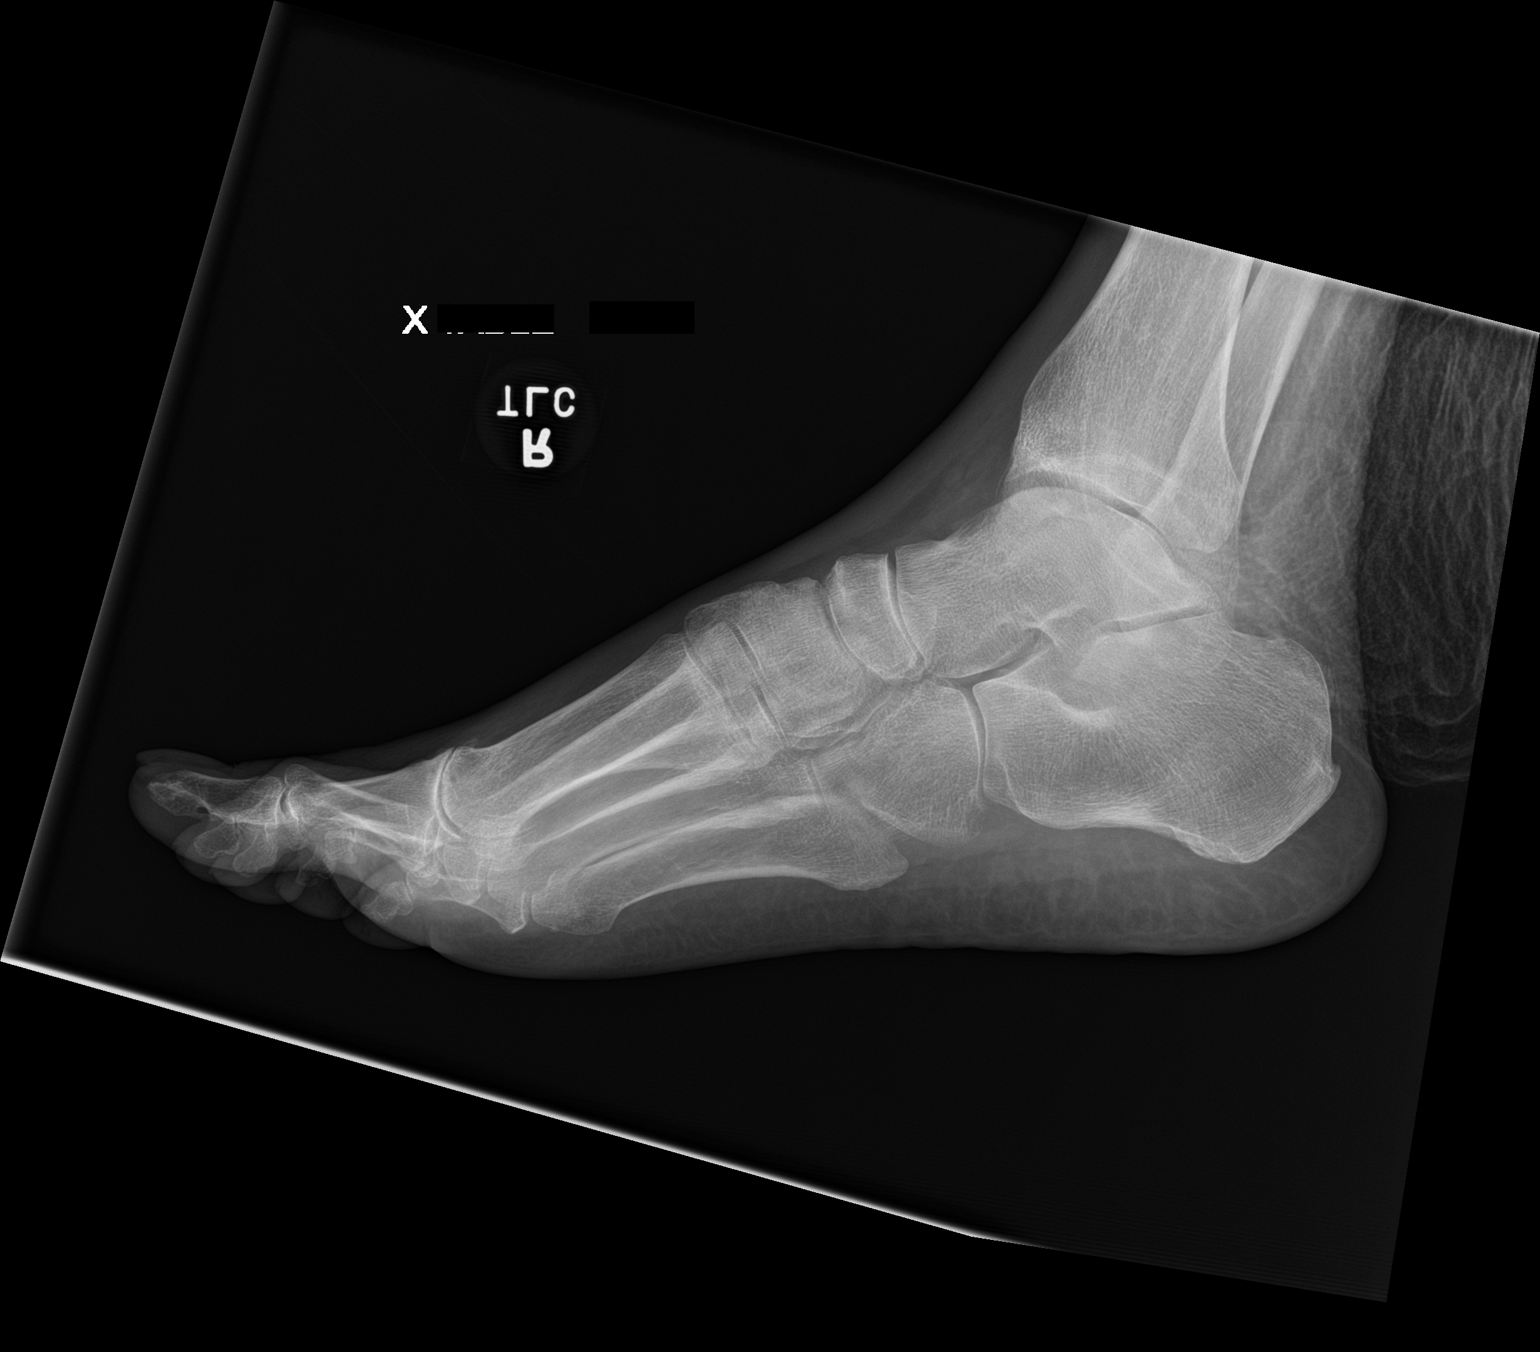

[3 of 3 positions shown; findings below may reference images not displayed]

FINDINGS: No acute fracture or dislocation. The ankle mortise is symmetric.
The talar dome is intact. Mild to moderate first MTP joint
osteoarthritis. Remaining joint spaces are preserved. Osteopenia.
Soft tissues are unremarkable.
IMPRESSION: 1. No acute osseous abnormality of the right ankle or foot.
2. Mild to moderate first MTP joint osteoarthritis.

## 2021-09-23 DIAGNOSIS — E559 Vitamin D deficiency, unspecified: Secondary | ICD-10-CM | POA: Insufficient documentation

## 2022-01-15 DIAGNOSIS — R011 Cardiac murmur, unspecified: Secondary | ICD-10-CM | POA: Insufficient documentation

## 2022-04-04 ENCOUNTER — Emergency Department (HOSPITAL_COMMUNITY): Payer: Medicare Other

## 2022-04-04 ENCOUNTER — Other Ambulatory Visit: Payer: Self-pay

## 2022-04-04 ENCOUNTER — Emergency Department (HOSPITAL_COMMUNITY)
Admission: EM | Admit: 2022-04-04 | Discharge: 2022-04-04 | Disposition: A | Discharge status: Home / Self Care | Payer: Medicare Other | Attending: Emergency Medicine | Admitting: Emergency Medicine

## 2022-04-04 ENCOUNTER — Encounter (HOSPITAL_COMMUNITY): Payer: Self-pay

## 2022-04-04 DIAGNOSIS — M25512 Pain in left shoulder: Secondary | ICD-10-CM

## 2022-04-04 DIAGNOSIS — Z7902 Long term (current) use of antithrombotics/antiplatelets: Secondary | ICD-10-CM | POA: Insufficient documentation

## 2022-04-04 DIAGNOSIS — R000 Tachycardia, unspecified: Secondary | ICD-10-CM | POA: Insufficient documentation

## 2022-04-04 MED ORDER — DICLOFENAC SODIUM 1 % EX GEL: 4.0000 g | Freq: Four times a day (QID) | CUTANEOUS | 0 refills | Status: AC

## 2022-04-04 MED ORDER — KETOROLAC TROMETHAMINE 15 MG/ML IJ SOLN
15.0000 mg | Freq: Once | INTRAMUSCULAR | Status: AC
  Administered 2022-04-04: 15 mg via INTRAMUSCULAR
  Filled 2022-04-04: qty 1

## 2022-04-04 MED ORDER — ACETAMINOPHEN 500 MG PO TABS
1000.0000 mg | ORAL_TABLET | Freq: Once | ORAL | Status: AC
  Administered 2022-04-04: 1000 mg via ORAL
  Filled 2022-04-04: qty 2

## 2022-04-04 NOTE — Discharge Instructions (Signed)
Use the gel as prescribed Also take tylenol '1000mg'$ (2 extra strength) four times a day.   You do need to take your arm out of the sling and move around at least 4 times a day.  Please follow-up with your family doctor in the office.  Please return for fever for redness of the shoulder.

## 2022-04-04 NOTE — ED Notes (Signed)
Patient transported to X-ray 

## 2022-04-04 NOTE — ED Provider Notes (Signed)
Philip Gray Provider Note   CSN: [id number] Arrival date & time: 04/04/22  0636     History  Chief Complaint  Patient presents with   Shoulder Pain    Philip Gray is a 89 y.o. male.  89 yo M with a chief complaint of left shoulder pain.  This been going on for a few days but per the wife has been going on for 3 weeks.  He denies any injury to the area.  Worse with movement twisting or lying in certain positions.  Really struggled to sleep the past couple days is what brought him here for evaluation.  He tells me that he had appointment with his family doctor last week but did not bring up the shoulder pain.  No fevers.  He does think that he had had some pain in his right shoulder that now maybe is in his left.   Shoulder Pain      Home Medications Prior to Admission medications   Medication Sig Start Date End Date Taking? Authorizing Provider  diclofenac Sodium (VOLTAREN) 1 % GEL Apply 4 g topically 4 (four) times daily. 04/04/22  Yes Deno Etienne, DO  amLODipine (NORVASC) 2.5 MG tablet Take 2.5 mg by mouth at bedtime. 10/24/17   [provider]  atorvastatin (LIPITOR) 80 MG tablet Take 80 mg by mouth at bedtime.     [provider]  clopidogrel (PLAVIX) 75 MG tablet Take 75 mg by mouth at bedtime.  11/13/13   [provider]      Allergies    Patient has no known allergies.    Review of Systems   Review of Systems  Physical Exam Updated Vital Signs BP (!) 175/62   Pulse 94   Temp 98.5 F (36.9 C) (Oral)   Resp (!) 24   SpO2 99%  Physical Exam Vitals and nursing note reviewed.  Constitutional:      Appearance: He is well-developed.  HENT:     Head: Normocephalic and atraumatic.  Eyes:     Pupils: Pupils are equal, round, and reactive to light.  Neck:     Vascular: No JVD.  Cardiovascular:     Rate and Rhythm: Normal rate and regular rhythm.     Heart sounds: No murmur heard.    No friction  rub. No gallop.  Pulmonary:     Effort: No respiratory distress.     Breath sounds: No wheezing.  Abdominal:     General: There is no distension.     Tenderness: There is no abdominal tenderness. There is no guarding or rebound.  Musculoskeletal:        General: Tenderness present. Normal range of motion.     Cervical back: Normal range of motion and neck supple.     Comments: Tenderness about the deltoid laterally, some pain at the [facility] joint.  Pain with range of motion difficult to assess rotator cuff or bursa.  Pulse motor and sensation intact distally.  Skin:    Coloration: Skin is not pale.     Findings: No rash.  Neurological:     Mental Status: He is alert and oriented to person, place, and time.  Psychiatric:        Behavior: Behavior normal.     ED Results / Procedures / Treatments   Labs (all labs ordered are listed, but only abnormal results are displayed) Labs Reviewed - No data to display  EKG EKG Interpretation  Date/Time:  $'Sunday April 04 2022 06:52:25 EST Ventricular Rate:  97 PR Interval:  207 QRS Duration: 119 QT Interval:  380 QTC Calculation: 483 R Axis:   -69 Text Interpretation: Sinus tachycardia Ventricular trigeminy Borderline prolonged PR interval Incomplete right bundle branch block LVH with IVCD, LAD and secondary repol abnrm Borderline prolonged QT interval No old tracing to compare Confirmed by Andriel Omalley ([id number]) on 04/04/2022 7:05:10 AM  Radiology DG Shoulder Left  Result Date: 04/04/2022 CLINICAL DATA:  Chronic left shoulder pain EXAM: LEFT SHOULDER - 3 VIEW COMPARISON:  None Available. FINDINGS: Left rotator cuff calcification. No fracture, bone lesion, or glenohumeral joint space narrowing. Prominent degenerative spurring at the AC joint. IMPRESSION: 1. Acromioclavicular osteoarthritis. 2. Rotator cuff calcification. Electronically Signed   By: Jonathan  Watts M.D.   On: 04/04/2022 07:43    Procedures Procedures    Medications Ordered in  ED Medications  ketorolac (TORADOL) 15 MG/ML injection 15 mg (15 mg Intramuscular Given 04/04/22 0726)  acetaminophen (TYLENOL) tablet 1,000 mg (1,000 mg Oral Given 04/04/22 0726)    ED Course/ Medical Decision Making/ A&P                             Medical Decision Making Amount and/or Complexity of Data Reviewed Radiology: ordered.  Risk OTC drugs. Prescription drug management.   88 yo M with a chief complaint of left shoulder pain.  Reproduced with palpation and movement of the left arm.  Not exertional no chest pain.  Will obtain a plain film of the left shoulder.  Treat supportively.  PCP follow-up.  Plain film of the shoulder independently interpreted by me without fracture or dislocation.  Placed in a sling for comfort.  8:05 AM:  I have discussed the diagnosis/risks/treatment options with the patient and family.  Evaluation and diagnostic testing in the emergency department does not suggest an emergent condition requiring admission or immediate intervention beyond what has been performed at this time.  They will follow up with PCP. We also discussed returning to the ED immediately if new or worsening sx occur. We discussed the sx which are most concerning (e.g., sudden worsening pain, fever, inability to tolerate by mouth) that necessitate immediate return. Medications administered to the patient during their visit and any new prescriptions provided to the patient are listed below.  Medications given during this visit Medications  ketorolac (TORADOL) 15 MG/ML injection 15 mg (15 mg Intramuscular Given 04/04/22 0726)  acetaminophen (TYLENOL) tablet 1,000 mg (1,000 mg Oral Given 04/04/22 0726)     The patient appears reasonably screen and/or stabilized for discharge and I doubt any other medical condition or other EMC requiring further screening, evaluation, or treatment in the ED at this time prior to discharge.          Final Clinical Impression(s) / ED Diagnoses Final  diagnoses:  Acute pain of left shoulder    Rx / DC Orders ED Discharge Orders          Ordered    diclofenac Sodium (VOLTAREN) 1 % GEL  4 times daily        02'A$ /04/24 0804              Deno Etienne, DO 04/04/22 1937

## 2022-04-04 NOTE — ED Triage Notes (Signed)
Pt states that he has been having pain in his L shoulder for the past two days, denies heavy lifting. Denies CP

## 2022-04-06 ENCOUNTER — Emergency Department (HOSPITAL_COMMUNITY)
Admission: EM | Admit: 2022-04-06 | Discharge: 2022-04-06 | Disposition: A | Discharge status: Home / Self Care | Payer: Medicare Other | Source: Home / Self Care | Attending: Emergency Medicine | Admitting: Emergency Medicine

## 2022-04-06 ENCOUNTER — Other Ambulatory Visit: Payer: Self-pay

## 2022-04-06 ENCOUNTER — Encounter (HOSPITAL_COMMUNITY): Payer: Self-pay

## 2022-04-06 DIAGNOSIS — M79651 Pain in right thigh: Secondary | ICD-10-CM | POA: Insufficient documentation

## 2022-04-06 DIAGNOSIS — Z7902 Long term (current) use of antithrombotics/antiplatelets: Secondary | ICD-10-CM | POA: Insufficient documentation

## 2022-04-06 DIAGNOSIS — E119 Type 2 diabetes mellitus without complications: Secondary | ICD-10-CM | POA: Insufficient documentation

## 2022-04-06 DIAGNOSIS — M25512 Pain in left shoulder: Secondary | ICD-10-CM | POA: Insufficient documentation

## 2022-04-06 DIAGNOSIS — D72829 Elevated white blood cell count, unspecified: Secondary | ICD-10-CM | POA: Insufficient documentation

## 2022-04-06 DIAGNOSIS — E871 Hypo-osmolality and hyponatremia: Secondary | ICD-10-CM | POA: Diagnosis not present

## 2022-04-06 DIAGNOSIS — M79604 Pain in right leg: Secondary | ICD-10-CM

## 2022-04-06 DIAGNOSIS — M25511 Pain in right shoulder: Secondary | ICD-10-CM | POA: Insufficient documentation

## 2022-04-06 DIAGNOSIS — I10 Essential (primary) hypertension: Secondary | ICD-10-CM | POA: Insufficient documentation

## 2022-04-06 DIAGNOSIS — Z79899 Other long term (current) drug therapy: Secondary | ICD-10-CM | POA: Insufficient documentation

## 2022-04-06 LAB — CBC WITH DIFFERENTIAL/PLATELET
Abs Immature Granulocytes: 0.05 10*3/uL (ref 0.00–0.07)
Basophils Absolute: 0 10*3/uL (ref 0.0–0.1)
Basophils Relative: 0 %
Eosinophils Absolute: 0.1 10*3/uL (ref 0.0–0.5)
Eosinophils Relative: 1 %
HCT: 39.9 % (ref 39.0–52.0)
Hemoglobin: 13.4 g/dL (ref 13.0–17.0)
Immature Granulocytes: 1 %
Lymphocytes Relative: 12 %
Lymphs Abs: 1.2 10*3/uL (ref 0.7–4.0)
MCH: 28.4 pg (ref 26.0–34.0)
MCHC: 33.6 g/dL (ref 30.0–36.0)
MCV: 84.5 fL (ref 80.0–100.0)
Monocytes Absolute: 0.8 10*3/uL (ref 0.1–1.0)
Monocytes Relative: 8 %
Neutro Abs: 8.6 10*3/uL — ABNORMAL HIGH (ref 1.7–7.7)
Neutrophils Relative %: 78 %
Platelets: 283 10*3/uL (ref 150–400)
RBC: 4.72 MIL/uL (ref 4.22–5.81)
RDW: 14.3 % (ref 11.5–15.5)
WBC: 10.8 10*3/uL — ABNORMAL HIGH (ref 4.0–10.5)
nRBC: 0 % (ref 0.0–0.2)

## 2022-04-06 LAB — BASIC METABOLIC PANEL
Anion gap: 10 (ref 5–15)
BUN: 18 mg/dL (ref 8–23)
CO2: 19 mmol/L — ABNORMAL LOW (ref 22–32)
Calcium: 9.5 mg/dL (ref 8.9–10.3)
Chloride: 97 mmol/L — ABNORMAL LOW (ref 98–111)
Creatinine, Ser: 1.19 mg/dL (ref 0.61–1.24)
GFR, Estimated: 59 mL/min — ABNORMAL LOW (ref >60)
Glucose, Bld: 126 mg/dL — ABNORMAL HIGH (ref 70–99)
Potassium: 4.5 mmol/L (ref 3.5–5.1)
Sodium: 126 mmol/L — ABNORMAL LOW (ref 135–145)

## 2022-04-06 MED ORDER — SODIUM CHLORIDE 0.9 % IV BOLUS
500.0000 mL | Freq: Once | INTRAVENOUS | Status: AC
  Administered 2022-04-06: 500 mL via INTRAVENOUS

## 2022-04-06 NOTE — ED Triage Notes (Signed)
Pt c/o right arm, right leg started yesterday and left shoulder [id number]. Pt denies any other sx.

## 2022-04-06 NOTE — ED Provider Notes (Signed)
[facility] Provider Note   CSN: [id number] Arrival date & time: 04/06/22  [id number]     History  Chief Complaint  Patient presents with   body pain    Philip Gray is a 89 y.o. male with history of peripheral vascular disease, diabetes, hyperlipidemia, tobacco abuse who presents the emergency department complaining of joint pain.  Patient specifically complaining of pain in his right shoulder, right thigh, and left shoulder.  States left shoulder pains been going on for several weeks and was seen in the ER few days ago for this.  Started having right arm and right leg pain yesterday.  Has been taking Tylenol at home with good relief.  Was using a heating pad last night.  Denies any other symptoms.  Had previously seen orthopedics for knee replacement many years ago.  Wife at bedside had requested that Philip Gray have baseline labs obtained. No reported injuries.   HPI     Home Medications Prior to Admission medications   Medication Sig Start Date End Date Taking? Authorizing Provider  amLODipine (NORVASC) 2.5 MG tablet Take 2.5 mg by mouth at bedtime. 10/24/17   [provider]  atorvastatin (LIPITOR) 80 MG tablet Take 80 mg by mouth at bedtime.     [provider]  clopidogrel (PLAVIX) 75 MG tablet Take 75 mg by mouth at bedtime.  11/13/13   [provider]  diclofenac Sodium (VOLTAREN) 1 % GEL Apply 4 g topically 4 (four) times daily. 04/04/22   Deno Etienne, DO      Allergies    Patient has no known allergies.    Review of Systems   Review of Systems  Constitutional:  Negative for chills and fever.  Musculoskeletal:  Positive for arthralgias.  Neurological:  Negative for numbness.  All other systems reviewed and are negative.   Physical Exam Updated Vital Signs BP (!) 175/62   Pulse 93   Temp 98.3 F (36.8 C) (Oral)   Resp 17   Ht 5' 8"$  (1.727 m)   Wt 71.6 kg   SpO2 97%   BMI 24.00 kg/m  Physical Exam Vitals  and nursing note reviewed.  Constitutional:      Appearance: Normal appearance.  HENT:     Head: Normocephalic and atraumatic.  Eyes:     Conjunctiva/sclera: Conjunctivae normal.  Cardiovascular:     Rate and Rhythm: Normal rate and regular rhythm.     Pulses:          Dorsalis pedis pulses are 1+ on the right side and 1+ on the left side.  Pulmonary:     Effort: Pulmonary effort is normal. No respiratory distress.     Breath sounds: Normal breath sounds.  Abdominal:     General: There is no distension.     Palpations: Abdomen is soft.     Tenderness: There is no abdominal tenderness.  Musculoskeletal:     Comments: Tenderness to palpation over bilateral AC joints.  No deformities noted.  Normal strength in all extremities.  Reproducible tenderness to palpation of the quadriceps muscle.  Femur without tenderness, and no deformities.  Skin:    General: Skin is warm and dry.  Neurological:     General: No focal deficit present.     Mental Status: Philip Gray is alert.     ED Results / Procedures / Treatments   Labs (all labs ordered are listed, but only abnormal results are displayed) Labs Reviewed  BASIC METABOLIC PANEL -  Abnormal; Notable for the following components:      Result Value   Sodium 126 (*)    Chloride 97 (*)    CO2 19 (*)    Glucose, Bld 126 (*)    GFR, Estimated 59 (*)    All other components within normal limits  CBC WITH DIFFERENTIAL/PLATELET - Abnormal; Notable for the following components:   WBC 10.8 (*)    Neutro Abs 8.6 (*)    All other components within normal limits    EKG None  Radiology No results found.  Procedures Procedures    Medications Ordered in ED Medications  sodium chloride 0.9 % bolus 500 mL (0 mLs Intravenous Stopped 04/06/22 1205)    ED Course/ Medical Decision Making/ A&P                             Medical Decision Making Amount and/or Complexity of Data Reviewed Labs: ordered.  This patient is a 89 y.o. male  who  presents to the ED for concern of joint pain.   Differential diagnoses prior to evaluation: The emergent differential diagnosis includes, but is not limited to,  arthritis, septic joint, malignancy, viral illness, injury, gout. This is not an exhaustive differential.   Past Medical History / Co-morbidities: peripheral vascular disease, diabetes, hyperlipidemia, tobacco abuse  Additional history: Chart reviewed. Pertinent results include: Seen in ER for left shoulder pain on 2/4. X-ray performed which showed arthritis of left AC joint. Given toradol and tylenol, given prescription for voltaren gel.   Physical Exam: Physical exam performed. The pertinent findings include: Hypertensive, but has not taken morning BP meds. Normal ROM of all joints with normal strength and sensation. Tenderness to palpation over bilateral AC joints of the shoulders. Muscular tenderness of right quadriceps muscle, no femur/hip tenderness or deformity.   Lab Tests/Imaging studies: I personally interpreted labs/imaging and the pertinent results include:  mild leukocytosis, unremarkable. Sodium of 126, chloride 97. Normal kidney function.   Medications: Given IV fluids  Disposition: After consideration of the diagnostic results and the patients response to treatment, I feel that emergency department workup does not suggest an emergent condition requiring admission or immediate intervention beyond what has been performed at this time. The plan is: discharge to home with symptomatic management of likely pain related to arthritis as seen on previous x-rays. Recommend OTC meds and voltaren as previously prescribed. Will follow up with PCP, recommended discussing possible orthopedics. No concern for septic joint. Patient clinically appears well. The patient is safe for discharge and has been instructed to return immediately for worsening symptoms, change in symptoms or any other concerns.  Final Clinical Impression(s) / ED  Diagnoses Final diagnoses:  Pain of both shoulder joints  Right leg pain    Rx / DC Orders ED Discharge Orders     None      Portions of this report may have been transcribed using voice recognition software. Every effort was made to ensure accuracy; however, inadvertent computerized transcription errors may be present.    Estill Cotta 04/06/22 1328    Margette Fast, MD 04/09/22 [phone]

## 2022-04-06 NOTE — Discharge Instructions (Addendum)
You were seen in the ER for joint pain.  As we discussed, your lab work was overall reassuring. Your sodium level was a bit low, so we gave you IV fluids and I want you to follow up with your primary doctor in the next week or so to have your labs rechecked.   I think your pain is likely related to arthritis. I recommend discussing this with your primary doctor, especially after your upcoming DEXA scan.  You can continue taking Tylenol every 4 hours as needed.  The pain that you are having might not entirely resolved, but there is other things that we can do to try to control it.  Continue to monitor how you're doing and return to the ER for new or worsening symptoms.

## 2022-04-07 ENCOUNTER — Emergency Department (HOSPITAL_COMMUNITY): Payer: Medicare Other

## 2022-04-07 ENCOUNTER — Inpatient Hospital Stay (HOSPITAL_COMMUNITY)
Admission: EM | Admit: 2022-04-07 | Discharge: 2022-04-09 | DRG: 641 | Disposition: A | Discharge status: Home / Self Care | Payer: Medicare Other | Attending: Internal Medicine | Admitting: Internal Medicine

## 2022-04-07 ENCOUNTER — Encounter (HOSPITAL_COMMUNITY): Payer: Self-pay | Admitting: Emergency Medicine

## 2022-04-07 DIAGNOSIS — I493 Ventricular premature depolarization: Secondary | ICD-10-CM | POA: Diagnosis present

## 2022-04-07 DIAGNOSIS — E871 Hypo-osmolality and hyponatremia: Secondary | ICD-10-CM | POA: Diagnosis not present

## 2022-04-07 DIAGNOSIS — N1831 Chronic kidney disease, stage 3a: Secondary | ICD-10-CM | POA: Diagnosis present

## 2022-04-07 DIAGNOSIS — I7143 Infrarenal abdominal aortic aneurysm, without rupture: Secondary | ICD-10-CM | POA: Diagnosis present

## 2022-04-07 DIAGNOSIS — R531 Weakness: Principal | ICD-10-CM

## 2022-04-07 DIAGNOSIS — Z96652 Presence of left artificial knee joint: Secondary | ICD-10-CM | POA: Diagnosis present

## 2022-04-07 DIAGNOSIS — E785 Hyperlipidemia, unspecified: Secondary | ICD-10-CM | POA: Diagnosis present

## 2022-04-07 DIAGNOSIS — F17210 Nicotine dependence, cigarettes, uncomplicated: Secondary | ICD-10-CM | POA: Diagnosis present

## 2022-04-07 DIAGNOSIS — J449 Chronic obstructive pulmonary disease, unspecified: Secondary | ICD-10-CM | POA: Diagnosis present

## 2022-04-07 DIAGNOSIS — R7989 Other specified abnormal findings of blood chemistry: Secondary | ICD-10-CM | POA: Diagnosis not present

## 2022-04-07 DIAGNOSIS — Z8249 Family history of ischemic heart disease and other diseases of the circulatory system: Secondary | ICD-10-CM

## 2022-04-07 DIAGNOSIS — E1151 Type 2 diabetes mellitus with diabetic peripheral angiopathy without gangrene: Secondary | ICD-10-CM | POA: Diagnosis present

## 2022-04-07 DIAGNOSIS — N179 Acute kidney failure, unspecified: Secondary | ICD-10-CM

## 2022-04-07 DIAGNOSIS — E861 Hypovolemia: Secondary | ICD-10-CM | POA: Diagnosis present

## 2022-04-07 DIAGNOSIS — E860 Dehydration: Secondary | ICD-10-CM | POA: Diagnosis present

## 2022-04-07 DIAGNOSIS — E1122 Type 2 diabetes mellitus with diabetic chronic kidney disease: Secondary | ICD-10-CM | POA: Diagnosis present

## 2022-04-07 DIAGNOSIS — Z83438 Family history of other disorder of lipoprotein metabolism and other lipidemia: Secondary | ICD-10-CM

## 2022-04-07 DIAGNOSIS — Z833 Family history of diabetes mellitus: Secondary | ICD-10-CM

## 2022-04-07 DIAGNOSIS — Z809 Family history of malignant neoplasm, unspecified: Secondary | ICD-10-CM

## 2022-04-07 DIAGNOSIS — Z7902 Long term (current) use of antithrombotics/antiplatelets: Secondary | ICD-10-CM

## 2022-04-07 DIAGNOSIS — I129 Hypertensive chronic kidney disease with stage 1 through stage 4 chronic kidney disease, or unspecified chronic kidney disease: Secondary | ICD-10-CM | POA: Diagnosis present

## 2022-04-07 LAB — URINALYSIS, ROUTINE W REFLEX MICROSCOPIC
Bilirubin Urine: NEGATIVE
Glucose, UA: NEGATIVE mg/dL
Ketones, ur: NEGATIVE mg/dL
Leukocytes,Ua: NEGATIVE
Nitrite: NEGATIVE
Protein, ur: NEGATIVE mg/dL
Specific Gravity, Urine: 1.019 (ref 1.005–1.030)
pH: 5 (ref 5.0–8.0)

## 2022-04-07 LAB — TSH: TSH: 1.353 u[IU]/mL (ref 0.350–4.500)

## 2022-04-07 LAB — CBC
HCT: 40.6 % (ref 39.0–52.0)
Hemoglobin: 13.3 g/dL (ref 13.0–17.0)
MCH: 28.2 pg (ref 26.0–34.0)
MCHC: 32.8 g/dL (ref 30.0–36.0)
MCV: 86 fL (ref 80.0–100.0)
Platelets: 304 10*3/uL (ref 150–400)
RBC: 4.72 MIL/uL (ref 4.22–5.81)
RDW: 14.6 % (ref 11.5–15.5)
WBC: 14.6 10*3/uL — ABNORMAL HIGH (ref 4.0–10.5)
nRBC: 0 % (ref 0.0–0.2)

## 2022-04-07 LAB — TROPONIN I (HIGH SENSITIVITY)
Troponin I (High Sensitivity): 18 ng/L — ABNORMAL HIGH (ref <18)
Troponin I (High Sensitivity): 21 ng/L — ABNORMAL HIGH (ref <18)

## 2022-04-07 LAB — BASIC METABOLIC PANEL
Anion gap: 13 (ref 5–15)
BUN: 21 mg/dL (ref 8–23)
CO2: 19 mmol/L — ABNORMAL LOW (ref 22–32)
Calcium: 9.2 mg/dL (ref 8.9–10.3)
Chloride: 96 mmol/L — ABNORMAL LOW (ref 98–111)
Creatinine, Ser: 1.34 mg/dL — ABNORMAL HIGH (ref 0.61–1.24)
GFR, Estimated: 52 mL/min — ABNORMAL LOW (ref >60)
Glucose, Bld: 113 mg/dL — ABNORMAL HIGH (ref 70–99)
Potassium: 4.6 mmol/L (ref 3.5–5.1)
Sodium: 128 mmol/L — ABNORMAL LOW (ref 135–145)

## 2022-04-07 LAB — PHOSPHORUS: Phosphorus: 2.5 mg/dL (ref 2.5–4.6)

## 2022-04-07 LAB — MAGNESIUM: Magnesium: 2 mg/dL (ref 1.7–2.4)

## 2022-04-07 LAB — LACTIC ACID, PLASMA: Lactic Acid, Venous: 1.1 mmol/L (ref 0.5–1.9)

## 2022-04-07 MED ORDER — SODIUM CHLORIDE 0.9 % IV SOLN: Freq: Once | INTRAVENOUS | Status: AC

## 2022-04-07 NOTE — Assessment & Plan Note (Addendum)
-  presents with Na of 128 with AKI so likely hypovolemic  - on HCTZ, will hold  -continuous IV fluid and follow

## 2022-04-07 NOTE — ED Provider Notes (Signed)
[name] Provider Note   CSN: [id number] Arrival date & time: 04/07/22  1126     History  Chief Complaint  Patient presents with   Weakness    Philip Gray is a 89 y.o. male.  HPI Presents 1 day after being seen and evaluated here, following being seen in his 50 office earlier today with worsening weakness and sent for evaluation. He is here with his wife who assists with the history. Patient is multiple medical issues include COPD, pulmonary nodule, abdominal aneurysm.  He notes over the past few days, possible longer he has been progressively weaker, with soreness in his torso.  Seemingly the pain has been in the shoulders, though not entirely clear if it is occasional, pleuritic or positional. Yesterday's evaluation including x-rays was reassuring.    Home Medications Prior to Admission medications   Medication Sig Start Date End Date Taking? Authorizing Provider  amLODipine (NORVASC) 2.5 MG tablet Take 2.5 mg by mouth at bedtime. 10/24/17   [provider]  atorvastatin (LIPITOR) 80 MG tablet Take 80 mg by mouth at bedtime.     [provider]  clopidogrel (PLAVIX) 75 MG tablet Take 75 mg by mouth at bedtime.  11/13/13   [provider]  diclofenac Sodium (VOLTAREN) 1 % GEL Apply 4 g topically 4 (four) times daily. 04/04/22   Deno Etienne, DO      Allergies    Patient has no known allergies.    Review of Systems   Review of Systems  All other systems reviewed and are negative.   Physical Exam Updated Vital Signs BP (!) 144/55   Pulse 82   Temp 98.5 F (36.9 C)   Resp 17   Ht '5\' 8"'$  (1.727 m)   Wt 71.6 kg   SpO2 95%   BMI 24.00 kg/m  Physical Exam Vitals and nursing note reviewed.  Constitutional:      General: He is not in acute distress.    Appearance: He is well-developed.  HENT:     Head: Normocephalic and atraumatic.  Eyes:     Conjunctiva/sclera: Conjunctivae normal.   Cardiovascular:     Rate and Rhythm: Normal rate and regular rhythm.  Pulmonary:     Effort: Pulmonary effort is normal. No respiratory distress.     Breath sounds: No stridor.  Abdominal:     General: There is no distension.  Skin:    General: Skin is warm and dry.  Neurological:     Mental Status: He is alert and oriented to person, place, and time.     Motor: Atrophy present.  Psychiatric:        Cognition and Memory: Memory is impaired.     ED Results / Procedures / Treatments   Labs (all labs ordered are listed, but only abnormal results are displayed) Labs Reviewed  BASIC METABOLIC PANEL - Abnormal; Notable for the following components:      Result Value   Sodium 128 (*)    Chloride 96 (*)    CO2 19 (*)    Glucose, Bld 113 (*)    Creatinine, Ser 1.34 (*)    GFR, Estimated 52 (*)    All other components within normal limits  CBC - Abnormal; Notable for the following components:   WBC 14.6 (*)    All other components within normal limits  URINALYSIS, ROUTINE W REFLEX MICROSCOPIC - Abnormal; Notable for the following components:   Color, Urine AMBER (*)  APPearance HAZY (*)    Hgb urine dipstick SMALL (*)    Bacteria, UA RARE (*)    All other components within normal limits  TROPONIN I (HIGH SENSITIVITY) - Abnormal; Notable for the following components:   Troponin I (High Sensitivity) 18 (*)    All other components within normal limits  TROPONIN I (HIGH SENSITIVITY) - Abnormal; Notable for the following components:   Troponin I (High Sensitivity) 21 (*)    All other components within normal limits  MAGNESIUM  PHOSPHORUS  TSH  LACTIC ACID, PLASMA  LACTIC ACID, PLASMA  CBG MONITORING, ED    EKG EKG Interpretation  Date/Time:  Wednesday April 07 2022 21:41:00 EST Ventricular Rate:  82 PR Interval:  174 QRS Duration: 125 QT Interval:  396 QTC Calculation: 463 R Axis:   -59 Text Interpretation: Sinus rhythm Ventricular premature complex Right bundle  branch block LVH with IVCD and secondary repol abnrm Confirmed by Carmin Muskrat [phone]) on 04/07/2022 9:42:52 PM  Radiology CT Renal Stone Study  Result Date: 04/07/2022 CLINICAL DATA:  Flank pain.  Concern for kidney stone. EXAM: CT ABDOMEN AND PELVIS WITHOUT CONTRAST TECHNIQUE: Multidetector CT imaging of the abdomen and pelvis was performed following the standard protocol without IV contrast. RADIATION DOSE REDUCTION: This exam was performed according to the departmental dose-optimization program which includes automated exposure control, adjustment of the mA and/or kV according to patient size and/or use of iterative reconstruction technique. COMPARISON:  None Available. FINDINGS: Evaluation of this exam is limited in the absence of intravenous contrast. Lower chest: Background of emphysema. There are bibasilar subpleural atelectasis. The visualized lung bases are otherwise clear. Coronary vascular calcification. No intra-abdominal free air or free fluid. Hepatobiliary: The liver is unremarkable. No biliary dilatation. Multiple gallstones. No pericholecystic fluid or evidence of acute cholecystitis by CT. Ultrasound may provide better evaluation if there is clinical concern for acute cholecystitis. Pancreas: Unremarkable. No pancreatic ductal dilatation or surrounding inflammatory changes. Spleen: Normal in size without focal abnormality. Adrenals/Urinary Tract: The adrenal glands are unremarkable. There is no hydronephrosis or nephrolithiasis on either side. Renal vascular calcifications noted. The urinary bladder is unremarkable. Stomach/Bowel: There is sigmoid and distal colonic diverticulosis without active inflammatory changes. There is no bowel obstruction or active inflammation. There is a 5 cm duodenal diverticulum. The appendix is normal. Vascular/Lymphatic: Advanced aortoiliac atherosclerotic disease. There is a 4 cm fusiform infrarenal abdominal aortic aneurysm. There is narrowing of the iliac  arteries with advanced atherosclerotic calcification. A femorofemoral bypass graft is noted. Evaluation of the vessels and graph is limited in the absence of intravenous contrast. The IVC is unremarkable. No portal venous gas. There is no adenopathy. Reproductive: The prostate gland is mildly enlarged measuring 5 cm in transverse axial diameter. Other: None Musculoskeletal: Degenerative changes of the spine. No acute osseous pathology. IMPRESSION: 1. No acute intra-abdominal or pelvic pathology. No hydronephrosis or nephrolithiasis. 2. Cholelithiasis. 3. Colonic diverticulosis. No bowel obstruction. Normal appendix. 4. A 4 cm fusiform infrarenal abdominal aortic aneurysm. Recommend follow-up every 12 months and vascular consultation. This recommendation follows ACR consensus guidelines: White Paper of the ACR Incidental Findings Committee II on Vascular Findings. J Am Coll Radiol 2013; 17:494-496. 5.  Aortic Atherosclerosis (ICD10-I70.0). Electronically Signed   By: Anner Crete M.D.   On: 04/07/2022 22:41   DG Chest 2 View  Result Date: 04/07/2022 CLINICAL DATA:  Several day history of weakness EXAM: CHEST - 2 VIEW COMPARISON:  Chest radiograph dated 12/31/2017, CT chest dated 11/07/2008 FINDINGS: Slightly  low lung volumes. Bibasilar patchy opacities. Lateral left upper lobe nodule is not substantially changed from 11/07/2008. No pleural effusion or pneumothorax. The heart size and mediastinal contours are within normal limits. The visualized skeletal structures are unremarkable. IMPRESSION: 1. Slightly low lung volumes. Bibasilar patchy opacities, likely atelectasis. 2. Lateral left upper lobe nodule is not substantially changed from 11/07/2008. Electronically Signed   By: Darrin Nipper M.D.   On: 04/07/2022 12:15    Procedures Procedures    Medications Ordered in ED Medications  0.9 %  sodium chloride infusion (has no administration in time range)    ED Course/ Medical Decision Making/ A&P                              Medical Decision Making Patient with history of COPD, hyperlipidemia, CAD presents with possible shoulder pain, worsening weakness, after 2 prior ED visits in the past few days.  Here he is awake, alert, describes fatigue, and review of his records from his physicians office demonstrate trigeminy, with ST changes, vital signs initially reassuring.  Pulse ox 98% room air normal Cardiac 95 sinus normal Concern for ACS, pneumonia, dehydration, electrolyte abnormalities, labs x-ray sent.  Amount and/or Complexity of Data Reviewed Independent Historian: spouse External Data Reviewed: notes.    Details: As above physicians notes requesting evaluation, admission given concern for hyponatremia Labs: ordered. Decision-making details documented in ED Course. Radiology: ordered and independent interpretation performed. Decision-making details documented in ED Course. ECG/medicine tests: ordered and independent interpretation performed. Decision-making details documented in ED Course.  Risk Prescription drug management. Decision regarding hospitalization.  Elderly male presents with worsening weakness in spite of multiple ED visits over the past few days.  Here patient is awake and alert, has some mild tachycardia, no hypotension. No early evidence for infection.  Some suspicion for multifactorial weakness with hyponatremia, and slight elevation in his troponins likely contributory.  Patient does have abdominal aneurysm previously identified, will require monitoring, as an outpatient after discharge. Given his abnormalities patient began fluid resuscitation, was admitted for further monitoring, management.    final Clinical Impression(s) / ED Diagnoses Final diagnoses:  Weakness  Hyponatremia    Rx / DC Orders ED Discharge Orders     None         Carmin Muskrat, MD 04/07/22 2316

## 2022-04-07 NOTE — ED Triage Notes (Signed)
Pt endorse weakness since Sunday. Pt has been seen 3 times since this has been going on. EMS reports frequent PVCs and PACs.

## 2022-04-07 NOTE — ED Provider Triage Note (Signed)
Emergency Medicine Provider Triage Evaluation Note  Philip Gray , a 89 y.o. male  was evaluated in triage.  Pt complains of weakness. Pt have been seen 3 times this week for weakness.  Sxs ongoing x 4 days.  Mild chest discomfort yesterday, none today.  Felt heart palpitation.  Was seen at PCP office and sent here for further assessment due to PVC on EKG  Review of Systems  Positive: As above Negative: As above  Physical Exam  BP (!) 133/55   Pulse (!) 101   Temp 98 F (36.7 C) (Oral)   Resp 18   Ht 5' 8"$  (1.727 m)   Wt 71.6 kg   SpO2 93%   BMI 24.00 kg/m  Gen:   Awake, no distress   Resp:  Normal effort  MSK:   Moves extremities without difficulty  Other:    Medical Decision Making  Medically screening exam initiated at 11:47 AM.  Appropriate orders placed.  Philip Gray was informed that the remainder of the evaluation will be completed by another provider, this initial triage assessment does not replace that evaluation, and the importance of remaining in the ED until their evaluation is complete.     Domenic Moras, PA-C 04/07/22 1150

## 2022-04-07 NOTE — Assessment & Plan Note (Signed)
-  known infrarenal AAA now at 4 cm -recommend every 12 months imaging follow up

## 2022-04-07 NOTE — Assessment & Plan Note (Signed)
-  mildly elevated but flat at 18-->21. No chest or shoulder pain. -suspect more demand with dehydation

## 2022-04-07 NOTE — H&P (Signed)
History and Physical    Patient: Philip Gray LDJ:[id number] DOB: 1933-11-08 DOA: 04/07/2022 DOS: the patient was seen and examined on 04/08/2022 PCP: Helane Rima, MD  Patient coming from: Home  Chief Complaint:  Chief Complaint  Patient presents with   Weakness   HPI: Philip Gray is a 89 y.o. male with medical history significant of AAA, HTN, PVD, COPD, Type II DM, CKD 3a who presents with weakness.   Patient has been seen multiple times in the ER in the past 4 days for bilateral shoulder pain.  Left shoulder X-ray imaging on 04/04/2022 showed only osteoarthritis and rotator cuff calcification.  This was treated with NSAIDs.  He was noted to have hyponatremia of 126 yesterday.  He presented to PCP today with persistent hyponatremia and was asked to present to the ED. Shoulder pain has resolves. Had generalized weakness for the past few days with low appetite. No vomiting or diarrhea.   In the ED, he was afebrile normotensive on room air.  He was noted to have frequent beats of PVC and PAC on route with EMS.  Sodium of 128 with CBG of 113.  Potassium normal at 4.6.  Has AKI with mildly elevated creatinine of 1.34 from prior of 1.21.  CBC with mildly elevated WBC of 14.6.  Troponin mildly elevated at 18 and 21.  EKG on my review with left axis deviation, ventricular trigeminy and borderline PR prolongation.  CT renal stone search showed known infrarenal AAA at 4 cm.   Review of Systems: As mentioned in the history of present illness. All other systems reviewed and are negative. Past Medical History:  Diagnosis Date   Diabetes mellitus    Hyperlipidemia    Peripheral vascular disease ([place])    Substance abuse ([place])    Tobacco abuse   Past Surgical History:  Procedure Laterality Date   BIOPSY  01/03/2018   Procedure: BIOPSY;  Surgeon: Ronnette Juniper, MD;  Location[facility] ENDOSCOPY;  Service: Gastroenterology;;   COLONOSCOPY WITH PROPOFOL N/A 01/03/2018   Procedure: COLONOSCOPY WITH  PROPOFOL;  Surgeon: Ronnette Juniper, MD;  Location: [place];  Service: Gastroenterology;  Laterality: N/A;   ESOPHAGOGASTRODUODENOSCOPY (EGD) WITH PROPOFOL N/A 01/02/2018   Procedure: ESOPHAGOGASTRODUODENOSCOPY (EGD) WITH PROPOFOL;  Surgeon: Ronnette Juniper, MD;  Location: [place];  Service: Gastroenterology;  Laterality: N/A;   JOINT REPLACEMENT  2010   Left knee replacement   POLYPECTOMY  01/03/2018   Procedure: POLYPECTOMY;  Surgeon: Ronnette Juniper, MD;  Location: [place];  Service: Gastroenterology;;   Social History:  reports that he has been smoking cigarettes. He has a 45.00 pack-year smoking history. He has never used smokeless tobacco. He reports that he does not drink alcohol and does not use drugs.  No Known Allergies  Family History  Problem Relation Age of Onset   Cancer Sister    Diabetes Brother    Hyperlipidemia Mother    Heart attack Father     Prior to Admission medications   Medication Sig Start Date End Date Taking? Authorizing Provider  amLODipine (NORVASC) 2.5 MG tablet Take 2.5 mg by mouth at bedtime. 10/24/17   [provider]  atorvastatin (LIPITOR) 80 MG tablet Take 80 mg by mouth at bedtime.     [provider]  clopidogrel (PLAVIX) 75 MG tablet Take 75 mg by mouth at bedtime.  11/13/13   [provider]  diclofenac Sodium (VOLTAREN) 1 % GEL Apply 4 g topically 4 (four) times daily. 04/04/22   Deno Etienne, DO  Physical Exam: Vitals:   04/07/22 2059 04/07/22 2100 04/07/22 2200 04/07/22 2300  BP: (!) 142/60 (!) 139/53 (!) 144/55 (!) 143/57  Pulse: 91 92 82 79  Resp:   17 17  Temp: 98.5 F (36.9 C)     TempSrc:      SpO2: 95% 95% 95% 95%  Weight:      Height:       Constitutional: NAD, calm, comfortable, thin elderly male laying flat in bed Eyes: lids and conjunctivae normal ENMT: Mucous membranes are moist.  Neck: normal, supple Respiratory: clear to auscultation bilaterally, no wheezing, no crackles. Normal respiratory  effort. No accessory muscle use.  Cardiovascular: Regular rate and rhythm, no murmurs / rubs / gallops. No extremity edema. Abdomen:soft, non-distended, no tenderness,  Bowel sounds positive.  Musculoskeletal: no clubbing / cyanosis. No joint deformity upper and lower extremities. Good ROM, no contractures. Normal muscle tone.  Skin: no rashes, lesions, ulcers. No induration Neurologic: CN 2-12 grossly intact. Able to lift upper extremity against Gravity but not against resistance.  Right lower extremity able to lift against gravity but not against resistance compared to the left.  psychiatric: Normal judgment and insight. Alert and oriented x 3. Normal mood. Data Reviewed:  See HPI   Assessment and Plan: * Hyponatremia -presents with Na of 128 with AKI so likely hypovolemic  - on HCTZ, will hold  -continuous IV fluid and follow  Elevated troponin -mildly elevated but flat at 18-->21. No chest or shoulder pain. -suspect more demand with dehydation  Weakness -generalized weakness secondary to hyponatremia. Will need to ambulate once hyponatremia resolves to evaluate.   AKI (acute kidney injury) ([place]) -Has AKI with mildly elevated creatinine of 1.34 from prior of 1.21. -keep on continuous IV fluid and follow   Infrarenal abdominal aortic aneurysm (AAA) without rupture ([place]) -known infrarenal AAA now at 4 cm -recommend every 12 months imaging follow up      Advance Care Planning: Full  Consults: none  Family Communication: none at bedside  Severity of Illness: The appropriate patient status for this patient is OBSERVATION. Observation status is judged to be reasonable and necessary in order to provide the required intensity of service to ensure the patient's safety. The patient's presenting symptoms, physical exam findings, and initial radiographic and laboratory data in the context of their medical condition is felt to place them at decreased risk for further clinical  deterioration. Furthermore, it is anticipated that the patient will be medically stable for discharge from the hospital within 2 midnights of admission.   Author: Orene Desanctis, DO 04/08/2022 12:48 AM  For on call review [email or web address].

## 2022-04-07 NOTE — ED Triage Notes (Signed)
Pt called multiple times with no response

## 2022-04-07 NOTE — Assessment & Plan Note (Signed)
-  Has AKI with mildly elevated creatinine of 1.34 from prior of 1.21. -keep on continuous IV fluid and follow

## 2022-04-08 DIAGNOSIS — Z809 Family history of malignant neoplasm, unspecified: Secondary | ICD-10-CM | POA: Diagnosis not present

## 2022-04-08 DIAGNOSIS — E861 Hypovolemia: Secondary | ICD-10-CM | POA: Diagnosis present

## 2022-04-08 DIAGNOSIS — N1831 Chronic kidney disease, stage 3a: Secondary | ICD-10-CM | POA: Diagnosis present

## 2022-04-08 DIAGNOSIS — J449 Chronic obstructive pulmonary disease, unspecified: Secondary | ICD-10-CM | POA: Diagnosis present

## 2022-04-08 DIAGNOSIS — E1122 Type 2 diabetes mellitus with diabetic chronic kidney disease: Secondary | ICD-10-CM | POA: Diagnosis present

## 2022-04-08 DIAGNOSIS — E785 Hyperlipidemia, unspecified: Secondary | ICD-10-CM | POA: Diagnosis present

## 2022-04-08 DIAGNOSIS — Z833 Family history of diabetes mellitus: Secondary | ICD-10-CM | POA: Diagnosis not present

## 2022-04-08 DIAGNOSIS — E860 Dehydration: Secondary | ICD-10-CM | POA: Diagnosis present

## 2022-04-08 DIAGNOSIS — F17210 Nicotine dependence, cigarettes, uncomplicated: Secondary | ICD-10-CM | POA: Diagnosis present

## 2022-04-08 DIAGNOSIS — Z8249 Family history of ischemic heart disease and other diseases of the circulatory system: Secondary | ICD-10-CM | POA: Diagnosis not present

## 2022-04-08 DIAGNOSIS — Z83438 Family history of other disorder of lipoprotein metabolism and other lipidemia: Secondary | ICD-10-CM | POA: Diagnosis not present

## 2022-04-08 DIAGNOSIS — I493 Ventricular premature depolarization: Secondary | ICD-10-CM | POA: Diagnosis present

## 2022-04-08 DIAGNOSIS — N179 Acute kidney failure, unspecified: Secondary | ICD-10-CM | POA: Diagnosis present

## 2022-04-08 DIAGNOSIS — Z96652 Presence of left artificial knee joint: Secondary | ICD-10-CM | POA: Diagnosis present

## 2022-04-08 DIAGNOSIS — I129 Hypertensive chronic kidney disease with stage 1 through stage 4 chronic kidney disease, or unspecified chronic kidney disease: Secondary | ICD-10-CM | POA: Diagnosis present

## 2022-04-08 DIAGNOSIS — E1151 Type 2 diabetes mellitus with diabetic peripheral angiopathy without gangrene: Secondary | ICD-10-CM | POA: Diagnosis present

## 2022-04-08 DIAGNOSIS — Z7902 Long term (current) use of antithrombotics/antiplatelets: Secondary | ICD-10-CM | POA: Diagnosis not present

## 2022-04-08 DIAGNOSIS — I7143 Infrarenal abdominal aortic aneurysm, without rupture: Secondary | ICD-10-CM | POA: Diagnosis present

## 2022-04-08 DIAGNOSIS — R7989 Other specified abnormal findings of blood chemistry: Secondary | ICD-10-CM | POA: Diagnosis present

## 2022-04-08 DIAGNOSIS — E871 Hypo-osmolality and hyponatremia: Secondary | ICD-10-CM | POA: Diagnosis not present

## 2022-04-08 LAB — CBC
HCT: 36.6 % — ABNORMAL LOW (ref 39.0–52.0)
HCT: 38.2 % — ABNORMAL LOW (ref 39.0–52.0)
Hemoglobin: 12.3 g/dL — ABNORMAL LOW (ref 13.0–17.0)
Hemoglobin: 12.7 g/dL — ABNORMAL LOW (ref 13.0–17.0)
MCH: 28.5 pg (ref 26.0–34.0)
MCH: 28 pg (ref 26.0–34.0)
MCHC: 33.6 g/dL (ref 30.0–36.0)
MCHC: 33.2 g/dL (ref 30.0–36.0)
MCV: 84.9 fL (ref 80.0–100.0)
MCV: 84.1 fL (ref 80.0–100.0)
Platelets: 278 10*3/uL (ref 150–400)
Platelets: 302 10*3/uL (ref 150–400)
RBC: 4.31 MIL/uL (ref 4.22–5.81)
RBC: 4.54 MIL/uL (ref 4.22–5.81)
RDW: 14.6 % (ref 11.5–15.5)
RDW: 14.8 % (ref 11.5–15.5)
WBC: 10.8 10*3/uL — ABNORMAL HIGH (ref 4.0–10.5)
WBC: 10.1 10*3/uL (ref 4.0–10.5)
nRBC: 0 % (ref 0.0–0.2)
nRBC: 0 % (ref 0.0–0.2)

## 2022-04-08 LAB — BASIC METABOLIC PANEL
Anion gap: 10 (ref 5–15)
Anion gap: 11 (ref 5–15)
Anion gap: 11 (ref 5–15)
BUN: 25 mg/dL — ABNORMAL HIGH (ref 8–23)
BUN: 20 mg/dL (ref 8–23)
BUN: 20 mg/dL (ref 8–23)
CO2: 22 mmol/L (ref 22–32)
CO2: 20 mmol/L — ABNORMAL LOW (ref 22–32)
CO2: 20 mmol/L — ABNORMAL LOW (ref 22–32)
Calcium: 8.9 mg/dL (ref 8.9–10.3)
Calcium: 9.3 mg/dL (ref 8.9–10.3)
Calcium: 9 mg/dL (ref 8.9–10.3)
Chloride: 95 mmol/L — ABNORMAL LOW (ref 98–111)
Chloride: 97 mmol/L — ABNORMAL LOW (ref 98–111)
Chloride: 99 mmol/L (ref 98–111)
Creatinine, Ser: 1.37 mg/dL — ABNORMAL HIGH (ref 0.61–1.24)
Creatinine, Ser: 1.14 mg/dL (ref 0.61–1.24)
Creatinine, Ser: 1.04 mg/dL (ref 0.61–1.24)
GFR, Estimated: 50 mL/min — ABNORMAL LOW (ref >60)
GFR, Estimated: >60 mL/min (ref >60)
GFR, Estimated: >60 mL/min (ref >60)
Glucose, Bld: 90 mg/dL (ref 70–99)
Glucose, Bld: 91 mg/dL (ref 70–99)
Glucose, Bld: 92 mg/dL (ref 70–99)
Potassium: 4 mmol/L (ref 3.5–5.1)
Potassium: 4.4 mmol/L (ref 3.5–5.1)
Potassium: 4.3 mmol/L (ref 3.5–5.1)
Sodium: 127 mmol/L — ABNORMAL LOW (ref 135–145)
Sodium: 128 mmol/L — ABNORMAL LOW (ref 135–145)
Sodium: 130 mmol/L — ABNORMAL LOW (ref 135–145)

## 2022-04-08 LAB — LACTIC ACID, PLASMA: Lactic Acid, Venous: 1 mmol/L (ref 0.5–1.9)

## 2022-04-08 LAB — OSMOLALITY: Osmolality: 277 mOsm/kg (ref 275–295)

## 2022-04-08 MED ORDER — IPRATROPIUM-ALBUTEROL 0.5-2.5 (3) MG/3ML IN SOLN: 3.0000 mL | RESPIRATORY_TRACT | Status: DC | PRN

## 2022-04-08 MED ORDER — LACTATED RINGERS IV SOLN: INTRAVENOUS | Status: DC

## 2022-04-08 MED ORDER — METOPROLOL TARTRATE 5 MG/5ML IV SOLN: 5.0000 mg | INTRAVENOUS | Status: DC | PRN

## 2022-04-08 MED ORDER — TRAZODONE HCL 50 MG PO TABS: 50.0000 mg | ORAL_TABLET | Freq: Every evening | ORAL | Status: DC | PRN

## 2022-04-08 MED ORDER — HYDRALAZINE HCL 20 MG/ML IJ SOLN: 10.0000 mg | INTRAMUSCULAR | Status: DC | PRN

## 2022-04-08 MED ORDER — ENOXAPARIN SODIUM 40 MG/0.4ML IJ SOSY
40.0000 mg | PREFILLED_SYRINGE | Freq: Every day | INTRAMUSCULAR | Status: DC
  Administered 2022-04-08 – 2022-04-09 (×2): 40 mg via SUBCUTANEOUS
  Filled 2022-04-08 (×2): qty 0.4

## 2022-04-08 MED ORDER — SENNOSIDES-DOCUSATE SODIUM 8.6-50 MG PO TABS: 1.0000 | ORAL_TABLET | Freq: Every evening | ORAL | Status: DC | PRN

## 2022-04-08 MED ORDER — GUAIFENESIN 100 MG/5ML PO LIQD: 5.0000 mL | ORAL | Status: DC | PRN

## 2022-04-08 MED ORDER — ONDANSETRON HCL 4 MG/2ML IJ SOLN: 4.0000 mg | Freq: Four times a day (QID) | INTRAMUSCULAR | Status: DC | PRN

## 2022-04-08 MED ORDER — SODIUM CHLORIDE 0.9 % IV SOLN: INTRAVENOUS | Status: AC

## 2022-04-08 NOTE — TOC Initial Note (Addendum)
Transition of Care [facility]) - Initial/Assessment Note    Patient Details  Name: Philip Gray MRN: [id number] Date of Birth: Aug 21, 1933  [facility] of [facility]) CM/SW Contact:    Bethann Berkshire, [place] Phone Number: 04/08/2022, 2:04 PM  Clinical Narrative:                    Pt presented to ED with shoulder pain and admitted with Hyponatremia, weakness, and AKI. Pt lives at home with spouse. PT/OT consulted. TOC will follow to assist with possible DC needs.      Patient Goals and CMS Choice            Expected Discharge Plan and Services                                              Prior Living Arrangements/Services                       Activities of Daily Living Home Assistive Devices/Equipment: None ADL Screening (condition at time of admission) Patient's cognitive ability adequate to safely complete daily activities?: Yes Is the patient deaf or have difficulty hearing?: No Does the patient have difficulty seeing, even when wearing glasses/contacts?: No Does the patient have difficulty concentrating, remembering, or making decisions?: No Patient able to express need for assistance with ADLs?: Yes Does the patient have difficulty dressing or bathing?: No Independently performs ADLs?: Yes (appropriate for developmental age) Does the patient have difficulty walking or climbing stairs?: Yes Weakness of Legs: Both Weakness of Arms/Hands: None  Permission Sought/Granted                  Emotional Assessment              Admission diagnosis:  Hyponatremia [E87.1] Weakness [R53.1] Patient Active Problem List   Diagnosis Date Noted   Hyponatremia 04/07/2022   AKI (acute kidney injury) ([place]) 04/07/2022   Weakness 04/07/2022   Elevated troponin 04/07/2022   Symptomatic anemia 12/31/2017   Aftercare following surgery of the circulatory system, NEC 04/25/2013   Atherosclerosis of native arteries of the extremities with intermittent claudication  03/24/2011   Infrarenal abdominal aortic aneurysm (AAA) without rupture ([place]) 03/24/2011   SMOKER 02/12/2009   COPD exacerbation ([place]) 02/12/2009   Hyperlipidemia 09/29/2007   PULMONARY NODULE 09/29/2007   PCP:  Helane Rima, MD Pharmacy:   [facility] - [place], [place] [facility] [address] [place] [address] [place] [address] Phone: [phone] Fax: [phone]  [place] [address] Phone: [phone] Fax: [phone]     Social Determinants of Health (SDOH) Social History: SDOH Screenings   Tobacco Use: High Risk (04/07/2022)   SDOH Interventions:     Readmission Risk Interventions     No data to display

## 2022-04-08 NOTE — ED Notes (Signed)
ED TO INPATIENT HANDOFF REPORT  ED Nurse Name and Phone #: Tiffany [phone]   S Name/Age/Gender Philip Gray 89 y.o. male Room/Bed: 001C/001C  Code Status   Code Status: Prior  Home/SNF/Other Home Patient oriented to: self, place, time, and situation Is this baseline? Yes   Triage Complete: Triage complete  Chief Complaint Hyponatremia [E87.1]  Triage Note Pt endorse weakness since Sunday. Pt has been seen 3 times since this has been going on. EMS reports frequent PVCs and PACs.  Pt called multiple times with no response.    Allergies No Known Allergies  Level of Care/Admitting Diagnosis ED Disposition     ED Disposition  Admit   Condition  --   Comment  Hospital Area: [place] [[id number]]  Level of Care: Telemetry Medical [104]  May place patient in observation at [facility] or [place] if equivalent level of care is available:: No  Covid Evaluation: Asymptomatic - no recent exposure (last 10 days) testing not required  Diagnosis: Hyponatremia [850277]  Admitting Physician: Orene Desanctis [[id number]]  Attending Physician: Orene Desanctis [[id number]]          B Medical/Surgery History Past Medical History:  Diagnosis Date   Diabetes mellitus    Hyperlipidemia    Peripheral vascular disease ([place])    Substance abuse ([place])    Tobacco abuse   Past Surgical History:  Procedure Laterality Date   BIOPSY  01/03/2018   Procedure: BIOPSY;  Surgeon: Ronnette Juniper, MD;  Location: [place];  Service: Gastroenterology;;   COLONOSCOPY WITH PROPOFOL N/A 01/03/2018   Procedure: COLONOSCOPY WITH PROPOFOL;  Surgeon: Ronnette Juniper, MD;  Location: [place];  Service: Gastroenterology;  Laterality: N/A;   ESOPHAGOGASTRODUODENOSCOPY (EGD) WITH PROPOFOL N/A 01/02/2018   Procedure: ESOPHAGOGASTRODUODENOSCOPY (EGD) WITH PROPOFOL;  Surgeon: Ronnette Juniper, MD;  Location: [place];  Service: Gastroenterology;  Laterality: N/A;   JOINT REPLACEMENT  2010   Left knee  replacement   POLYPECTOMY  01/03/2018   Procedure: POLYPECTOMY;  Surgeon: Ronnette Juniper, MD;  Location: [place];  Service: Gastroenterology;;     A IV Location/Drains/Wounds Patient Lines/Drains/Airways Status     Active Line/Drains/Airways     Name Placement date Placement time Site Days   Peripheral IV 04/07/22 20 G Anterior;Proximal;Right Forearm 04/07/22  1154  Forearm  1            Intake/Output Last 24 hours No intake or output data in the 24 hours ending 04/08/22 0011  Labs/Imaging Results for orders placed or performed during the hospital encounter of 04/07/22 (from the past 48 hour(s))  Basic metabolic panel     Status: Abnormal   Collection Time: 04/07/22 11:41 AM  Result Value Ref Range   Sodium 128 (L) 135 - 145 mmol/L   Potassium 4.6 3.5 - 5.1 mmol/L    Comment: HEMOLYSIS AT THIS LEVEL MAY AFFECT RESULT   Chloride 96 (L) 98 - 111 mmol/L   CO2 19 (L) 22 - 32 mmol/L   Glucose, Bld 113 (H) 70 - 99 mg/dL    Comment: Glucose reference range applies only to samples taken after fasting for at least 8 hours.   BUN 21 8 - 23 mg/dL   Creatinine, Ser 1.34 (H) 0.61 - 1.24 mg/dL   Calcium 9.2 8.9 - 10.3 mg/dL   GFR, Estimated 52 (L) >60 mL/min    Comment: (NOTE) Calculated using the CKD-EPI Creatinine Equation (2021)    Anion gap 13 5 - 15    Comment:  Performed at [facility], [place] [address] [place], [place] [address]  CBC     Status: Abnormal   Collection Time: 04/07/22 11:41 AM  Result Value Ref Range   WBC 14.6 (H) 4.0 - 10.5 K/uL   RBC 4.72 4.22 - 5.81 MIL/uL   Hemoglobin 13.3 13.0 - 17.0 g/dL   HCT 40.6 39.0 - 52.0 %   MCV 86.0 80.0 - 100.0 fL   MCH 28.2 26.0 - 34.0 pg   MCHC 32.8 30.0 - 36.0 g/dL   RDW 14.6 11.5 - 15.5 %   Platelets 304 150 - 400 K/uL   nRBC 0.0 0.0 - 0.2 %    Comment: Performed at [facility], [place] [address], [place], [place] [address]  Magnesium     Status: None   Collection Time: 04/07/22 11:41 AM  Result Value Ref  Range   Magnesium 2.0 1.7 - 2.4 mg/dL    Comment: Performed at [facility] [address] [place], [place] [address]  Phosphorus     Status: None   Collection Time: 04/07/22 11:41 AM  Result Value Ref Range   Phosphorus 2.5 2.5 - 4.6 mg/dL    Comment: Performed at [facility] [address], [place], [place] [address]  Troponin I (High Sensitivity)     Status: Abnormal   Collection Time: 04/07/22 11:41 AM  Result Value Ref Range   Troponin I (High Sensitivity) 18 (H) <18 ng/L    Comment: (NOTE) Elevated high sensitivity troponin I (hsTnI) values and significant  changes across serial measurements may suggest ACS but many other  chronic and acute conditions are known to elevate hsTnI results.  Refer to the "Links" section for chest pain algorithms and additional  guidance. Performed at [facility], [place] [address] [place], [place] [address]   TSH     Status: None   Collection Time: 04/07/22 11:53 AM  Result Value Ref Range   TSH 1.353 0.350 - 4.500 uIU/mL    Comment: Performed by a 3rd Generation assay with a functional sensitivity of <=0.01 uIU/mL. Performed at [facility], [place] [address], [place], [place] [address]   Troponin I (High Sensitivity)     Status: Abnormal   Collection Time: 04/07/22  2:54 PM  Result Value Ref Range   Troponin I (High Sensitivity) 21 (H) <18 ng/L    Comment: (NOTE) Elevated high sensitivity troponin I (hsTnI) values and significant  changes across serial measurements may suggest ACS but many other  chronic and acute conditions are known to elevate hsTnI results.  Refer to the "Links" section for chest pain algorithms and additional  guidance. Performed at [facility], [place] [address], [place],  [address]   Urinalysis, Routine w reflex microscopic -Urine, Clean Catch     Status: Abnormal   Collection Time: 04/07/22  3:27 PM  Result Value Ref Range   Color, Urine AMBER (A) YELLOW    Comment:  BIOCHEMICALS MAY BE AFFECTED BY COLOR   APPearance HAZY (A) CLEAR   Specific Gravity, Urine 1.019 1.005 - 1.030   pH 5.0 5.0 - 8.0   Glucose, UA NEGATIVE NEGATIVE mg/dL   Hgb urine dipstick SMALL (A) NEGATIVE   Bilirubin Urine NEGATIVE NEGATIVE   Ketones, ur NEGATIVE NEGATIVE mg/dL   Protein, ur NEGATIVE NEGATIVE mg/dL   Nitrite NEGATIVE NEGATIVE   Leukocytes,Ua NEGATIVE NEGATIVE   RBC / HPF 0-5 0 - 5 RBC/hpf  WBC, UA 0-5 0 - 5 WBC/hpf   Bacteria, UA RARE (A) NONE SEEN   Squamous Epithelial / HPF 0-5 0 - 5 /HPF   Mucus PRESENT     Comment: Performed at [facility], [place] [address], [place], [place] [address]  Lactic acid, plasma     Status: None   Collection Time: 04/07/22  9:31 PM  Result Value Ref Range   Lactic Acid, Venous 1.1 0.5 - 1.9 mmol/L    Comment: Performed at [facility] [address], [place], [place] [address]   CT Renal Stone Study  Result Date: 04/07/2022 CLINICAL DATA:  Flank pain.  Concern for kidney stone. EXAM: CT ABDOMEN AND PELVIS WITHOUT CONTRAST TECHNIQUE: Multidetector CT imaging of the abdomen and pelvis was performed following the standard protocol without IV contrast. RADIATION DOSE REDUCTION: This exam was performed according to the departmental dose-optimization program which includes automated exposure control, adjustment of the mA and/or kV according to patient size and/or use of iterative reconstruction technique. COMPARISON:  None Available. FINDINGS: Evaluation of this exam is limited in the absence of intravenous contrast. Lower chest: Background of emphysema. There are bibasilar subpleural atelectasis. The visualized lung bases are otherwise clear. Coronary vascular calcification. No intra-abdominal free air or free fluid. Hepatobiliary: The liver is unremarkable. No biliary dilatation. Multiple gallstones. No pericholecystic fluid or evidence of acute cholecystitis by CT. Ultrasound may provide better evaluation if there is clinical  concern for acute cholecystitis. Pancreas: Unremarkable. No pancreatic ductal dilatation or surrounding inflammatory changes. Spleen: Normal in size without focal abnormality. Adrenals/Urinary Tract: The adrenal glands are unremarkable. There is no hydronephrosis or nephrolithiasis on either side. Renal vascular calcifications noted. The urinary bladder is unremarkable. Stomach/Bowel: There is sigmoid and distal colonic diverticulosis without active inflammatory changes. There is no bowel obstruction or active inflammation. There is a 5 cm duodenal diverticulum. The appendix is normal. Vascular/Lymphatic: Advanced aortoiliac atherosclerotic disease. There is a 4 cm fusiform infrarenal abdominal aortic aneurysm. There is narrowing of the iliac arteries with advanced atherosclerotic calcification. A femorofemoral bypass graft is noted. Evaluation of the vessels and graph is limited in the absence of intravenous contrast. The IVC is unremarkable. No portal venous gas. There is no adenopathy. Reproductive: The prostate gland is mildly enlarged measuring 5 cm in transverse axial diameter. Other: None Musculoskeletal: Degenerative changes of the spine. No acute osseous pathology. IMPRESSION: 1. No acute intra-abdominal or pelvic pathology. No hydronephrosis or nephrolithiasis. 2. Cholelithiasis. 3. Colonic diverticulosis. No bowel obstruction. Normal appendix. 4. A 4 cm fusiform infrarenal abdominal aortic aneurysm. Recommend follow-up every 12 months and vascular consultation. This recommendation follows ACR consensus guidelines: White Paper of the ACR Incidental Findings Committee II on Vascular Findings. J Am Coll Radiol 2013; 66:440-347. 5.  Aortic Atherosclerosis (ICD10-I70.0). Electronically Signed   By: Anner Crete M.D.   On: 04/07/2022 22:41   DG Chest 2 View  Result Date: 04/07/2022 CLINICAL DATA:  Several day history of weakness EXAM: CHEST - 2 VIEW COMPARISON:  Chest radiograph dated 12/31/2017, CT  chest dated 11/07/2008 FINDINGS: Slightly low lung volumes. Bibasilar patchy opacities. Lateral left upper lobe nodule is not substantially changed from 11/07/2008. No pleural effusion or pneumothorax. The heart size and mediastinal contours are within normal limits. The visualized skeletal structures are unremarkable. IMPRESSION: 1. Slightly low lung volumes. Bibasilar patchy opacities, likely atelectasis. 2. Lateral left upper lobe nodule is not substantially changed from 11/07/2008. Electronically Signed   By: Shawn Route.D.  On: 04/07/2022 12:15    Pending Labs Unresulted Labs (From admission, onward)     Start     Ordered   04/07/22 1638  Lactic acid, plasma  Now then every 2 hours,   R (with STAT occurrences)      04/07/22 1637            Vitals/Pain Today's Vitals   04/07/22 2059 04/07/22 2100 04/07/22 2200 04/07/22 2300  BP: (!) 142/60 (!) 139/53 (!) 144/55 (!) 143/57  Pulse: 91 92 82 79  Resp:   17 17  Temp: 98.5 F (36.9 C)     TempSrc:      SpO2: 95% 95% 95% 95%  Weight:      Height:      PainSc:        Isolation Precautions No active isolations  Medications Medications  0.9 %  sodium chloride infusion ( Intravenous New Bag/Given 04/07/22 2328)    Mobility walks     Focused Assessments Weakness  R Recommendations: See Admitting Provider Note  Report given to:   Additional Notes: N/A

## 2022-04-08 NOTE — Assessment & Plan Note (Signed)
-  generalized weakness secondary to hyponatremia. Will need to ambulate once hyponatremia resolves to evaluate.

## 2022-04-08 NOTE — Progress Notes (Signed)
Philip Gray is a 89 y.o. male patient admitted. Awake, alert - oriented  X 4 - no acute distress noted.  VSS - Blood pressure (!) 152/59, pulse 88, temperature 98.9 F (37.2 C), temperature source Oral, resp. rate 16, height '5\' 8"'$  (1.727 m), weight 71.6 kg, SpO2 95 %.    IV in place, occlusive dsg intact without redness.  Orientation to room, and floor completed.  Admission INP armband ID verified with patient/family, and in place.   SR up x 2, fall assessment complete, with patient and family able to verbalize understanding of risk associated with falls, and verbalized understanding to call nsg before up out of bed.  Call light within reach, patient able to voice, and demonstrate understanding. No evidence of skin break down noted on exam.     Will cont to eval and treat per MD orders.  Vidal Schwalbe, RN 04/08/2022 1:35 AM

## 2022-04-08 NOTE — Progress Notes (Signed)
PROGRESS NOTE    Philip Gray  YDX:[id number] DOB: 08-14-1933 DOA: 04/07/2022 PCP: Helane Rima, MD   Brief Narrative:  89 y.o. male with medical history significant of AAA, HTN, PVD, COPD, Type II DM, CKD 3a who presents with weakness.  Initially presented to ED on 2/4 for shoulder pain which showed osteoarthritis and rotator cuff calcification on the x-ray and was discharged on NSAID.  Upon follow-up with PCP she was noted to have persistent hyponatremia therefore sent back to the ED.  Shoulder pain has now resolved.  Sodium level was noted to be 126.   Assessment & Plan:  Principal Problem:   Hyponatremia Active Problems:   Infrarenal abdominal aortic aneurysm (AAA) without rupture (HCC)   AKI (acute kidney injury) (HCC)   Weakness   Elevated troponin     Assessment and Plan: * Hyponatremia -Suspicion for hypovolemic hyponatremia getting IV fluids.  Will check urine sodium, osmolality and serum osmolality.  BMP every 8 hours.  Elevated troponin, demand Likely from dehydration.  Nonischemic  Weakness Generalized weakness from hyponatremia and dehydration.  Will get PT/OT  AKI (acute kidney injury) ([place]) Baseline creatinine 1.2, today is 1.37.   Infrarenal abdominal aortic aneurysm (AAA) without rupture (HCC) -known infrarenal AAA now at 4 cm -recommend every 12 months imaging follow up  Pending MedRec   DVT prophylaxis: Lovenox Code Status: Full code Family Communication:    Overall still feeling quite weak.  Sodium is still 128.  Will need to monitor for at least next 24 hours.    Subjective: Seen and [place] at bedside.  Today he feels slightly better compared to yesterday.  Moving around is better a little bit better as well.  Denies any chest pain, nausea, vomiting.   Examination:  General exam: Appears calm and comfortable  Respiratory system: Clear to auscultation. Respiratory effort normal. Cardiovascular system: S1 & S2 heard, RRR. No JVD, murmurs,  rubs, gallops or clicks. No pedal edema. Gastrointestinal system: Abdomen is nondistended, soft and nontender. No organomegaly or masses felt. Normal bowel sounds heard. Central nervous system: Alert and oriented. No focal neurological deficits. Extremities: Symmetric 5 x 5 power. Skin: No rashes, lesions or ulcers Psychiatry: Judgement and insight appear normal. Mood & affect appropriate.     Objective: Vitals:   04/07/22 2300 04/08/22 0058 04/08/22 0428 04/08/22 0735  BP: (!) 143/57 (!) 152/59 (!) 162/61 (!) 143/58  Pulse: 79 88 85 79  Resp: '17 16 16 17  '$ Temp:  98.9 F (37.2 C) 98.6 F (37 C) 98.6 F (37 C)  TempSrc:  Oral  Oral  SpO2: 95% 95% 96% 94%  Weight:      Height:        Intake/Output Summary (Last 24 hours) at 04/08/2022 0940 Last data filed at 04/08/2022 7209 Gross per 24 hour  Intake 487.79 ml  Output 500 ml  Net -12.21 ml   Filed Weights   04/07/22 1127  Weight: 71.6 kg     Data Reviewed:   CBC: Recent Labs  Lab 04/06/22 0805 04/07/22 1141 04/08/22 0148  WBC 10.8* 14.6* 10.8*  NEUTROABS 8.6*  --   --   HGB 13.4 13.3 12.3*  HCT 39.9 40.6 36.6*  MCV 84.5 86.0 84.9  PLT 283 304 470   Basic Metabolic Panel: Recent Labs  Lab 04/06/22 0805 04/07/22 1141 04/08/22 0148  NA 126* 128* 127*  K 4.5 4.6 4.0  CL 97* 96* 95*  CO2 19* 19* 22  GLUCOSE 126* 113* 90  BUN 18 21 25*  CREATININE 1.19 1.34* 1.37*  CALCIUM 9.5 9.2 8.9  MG  --  2.0  --   PHOS  --  2.5  --    GFR: Estimated Creatinine Clearance: 37.4 mL/min (A) (by C-G formula based on SCr of 1.37 mg/dL (H)). Liver Function Tests: No results for input(s): "AST", "ALT", "ALKPHOS", "BILITOT", "PROT", "ALBUMIN" in the last 168 hours. No results for input(s): "LIPASE", "AMYLASE" in the last 168 hours. No results for input(s): "AMMONIA" in the last 168 hours. Coagulation Profile: No results for input(s): "INR", "PROTIME" in the last 168 hours. Cardiac Enzymes: No results for input(s):  "CKTOTAL", "CKMB", "CKMBINDEX", "TROPONINI" in the last 168 hours. BNP (last 3 results) No results for input(s): "PROBNP" in the last 8760 hours. HbA1C: No results for input(s): "HGBA1C" in the last 72 hours. CBG: No results for input(s): "GLUCAP" in the last 168 hours. Lipid Profile: No results for input(s): "CHOL", "HDL", "LDLCALC", "TRIG", "CHOLHDL", "LDLDIRECT" in the last 72 hours. Thyroid Function Tests: Recent Labs    04/07/22 1153  TSH 1.353   Anemia Panel: No results for input(s): "VITAMINB12", "FOLATE", "FERRITIN", "TIBC", "IRON", "RETICCTPCT" in the last 72 hours. Sepsis Labs: Recent Labs  Lab 04/07/22 2131 04/08/22 0148  LATICACIDVEN 1.1 1.0    No results found for this or any previous visit (from the past 240 hour(s)).       Radiology Studies: CT Renal Stone Study  Result Date: 04/07/2022 CLINICAL DATA:  Flank pain.  Concern for kidney stone. EXAM: CT ABDOMEN AND PELVIS WITHOUT CONTRAST TECHNIQUE: Multidetector CT imaging of the abdomen and pelvis was performed following the standard protocol without IV contrast. RADIATION DOSE REDUCTION: This exam was performed according to the departmental dose-optimization program which includes automated exposure control, adjustment of the mA and/or kV according to patient size and/or use of iterative reconstruction technique. COMPARISON:  None Available. FINDINGS: Evaluation of this exam is limited in the absence of intravenous contrast. Lower chest: Background of emphysema. There are bibasilar subpleural atelectasis. The visualized lung bases are otherwise clear. Coronary vascular calcification. No intra-abdominal free air or free fluid. Hepatobiliary: The liver is unremarkable. No biliary dilatation. Multiple gallstones. No pericholecystic fluid or evidence of acute cholecystitis by CT. Ultrasound may provide better evaluation if there is clinical concern for acute cholecystitis. Pancreas: Unremarkable. No pancreatic ductal  dilatation or surrounding inflammatory changes. Spleen: Normal in size without focal abnormality. Adrenals/Urinary Tract: The adrenal glands are unremarkable. There is no hydronephrosis or nephrolithiasis on either side. Renal vascular calcifications noted. The urinary bladder is unremarkable. Stomach/Bowel: There is sigmoid and distal colonic diverticulosis without active inflammatory changes. There is no bowel obstruction or active inflammation. There is a 5 cm duodenal diverticulum. The appendix is normal. Vascular/Lymphatic: Advanced aortoiliac atherosclerotic disease. There is a 4 cm fusiform infrarenal abdominal aortic aneurysm. There is narrowing of the iliac arteries with advanced atherosclerotic calcification. A femorofemoral bypass graft is noted. Evaluation of the vessels and graph is limited in the absence of intravenous contrast. The IVC is unremarkable. No portal venous gas. There is no adenopathy. Reproductive: The prostate gland is mildly enlarged measuring 5 cm in transverse axial diameter. Other: None Musculoskeletal: Degenerative changes of the spine. No acute osseous pathology. IMPRESSION: 1. No acute intra-abdominal or pelvic pathology. No hydronephrosis or nephrolithiasis. 2. Cholelithiasis. 3. Colonic diverticulosis. No bowel obstruction. Normal appendix. 4. A 4 cm fusiform infrarenal abdominal aortic aneurysm. Recommend follow-up every 12 months and vascular consultation. This recommendation follows ACR consensus guidelines:  White Paper of the ACR Incidental Findings Committee II on Vascular Findings. J Am Coll Radiol 2013; 85:885-027. 5.  Aortic Atherosclerosis (ICD10-I70.0). Electronically Signed   By: Anner Crete M.D.   On: 04/07/2022 22:41   DG Chest 2 View  Result Date: 04/07/2022 CLINICAL DATA:  Several day history of weakness EXAM: CHEST - 2 VIEW COMPARISON:  Chest radiograph dated 12/31/2017, CT chest dated 11/07/2008 FINDINGS: Slightly low lung volumes. Bibasilar patchy  opacities. Lateral left upper lobe nodule is not substantially changed from 11/07/2008. No pleural effusion or pneumothorax. The heart size and mediastinal contours are within normal limits. The visualized skeletal structures are unremarkable. IMPRESSION: 1. Slightly low lung volumes. Bibasilar patchy opacities, likely atelectasis. 2. Lateral left upper lobe nodule is not substantially changed from 11/07/2008. Electronically Signed   By: Darrin Nipper M.D.   On: 04/07/2022 12:15        Scheduled Meds:  enoxaparin (LOVENOX) injection  40 mg Subcutaneous Daily   Continuous Infusions:  lactated ringers 75 mL/hr at 04/08/22 0103     LOS: 0 days   Time spent= 35 mins    Aleksa Collinsworth Arsenio Loader, MD [facility]  If 7PM-7AM, please contact night-coverage  04/08/2022, 9:40 AM

## 2022-04-09 DIAGNOSIS — E871 Hypo-osmolality and hyponatremia: Secondary | ICD-10-CM | POA: Diagnosis not present

## 2022-04-09 LAB — BASIC METABOLIC PANEL
Anion gap: 10 (ref 5–15)
Anion gap: 14 (ref 5–15)
BUN: 19 mg/dL (ref 8–23)
BUN: 17 mg/dL (ref 8–23)
CO2: 20 mmol/L — ABNORMAL LOW (ref 22–32)
CO2: 18 mmol/L — ABNORMAL LOW (ref 22–32)
Calcium: 9 mg/dL (ref 8.9–10.3)
Calcium: 9 mg/dL (ref 8.9–10.3)
Chloride: 100 mmol/L (ref 98–111)
Chloride: 100 mmol/L (ref 98–111)
Creatinine, Ser: 1.07 mg/dL (ref 0.61–1.24)
Creatinine, Ser: 1.04 mg/dL (ref 0.61–1.24)
GFR, Estimated: >60 mL/min (ref >60)
GFR, Estimated: >60 mL/min (ref >60)
Glucose, Bld: 97 mg/dL (ref 70–99)
Glucose, Bld: 139 mg/dL — ABNORMAL HIGH (ref 70–99)
Potassium: 4.3 mmol/L (ref 3.5–5.1)
Potassium: 4.2 mmol/L (ref 3.5–5.1)
Sodium: 130 mmol/L — ABNORMAL LOW (ref 135–145)
Sodium: 132 mmol/L — ABNORMAL LOW (ref 135–145)

## 2022-04-09 LAB — MAGNESIUM: Magnesium: 1.8 mg/dL (ref 1.7–2.4)

## 2022-04-09 NOTE — Progress Notes (Signed)
Pt discharged to home/self care by Sheila Oats, SWOT RN. See nurse's note for details.

## 2022-04-09 NOTE — Discharge Summary (Signed)
Physician Discharge Summary  Philip Gray [id number] DOB: [birth year] DOA: 04/07/2022  PCP: Helane Rima, MD  Admit date: 04/07/2022 Discharge date: 04/09/2022  Admitted From: Home Disposition: Home  Recommendations for Outpatient Follow-up:  Follow up with PCP in 1-2 weeks Please obtain BMP/CBC in one week your next doctors visit.     Discharge Condition: Stable CODE STATUS: Full code Diet recommendation: Regular  Brief/Interim Summary: 89 y.o. male with medical history significant of AAA, HTN, PVD, COPD, Type II DM, CKD 3a who presents with weakness.  Initially presented to ED on 2/4 for shoulder pain which showed osteoarthritis and rotator cuff calcification on the x-ray and was discharged on NSAID.  Upon follow-up with PCP she was noted to have persistent hyponatremia therefore sent back to the ED.  Shoulder pain has now resolved.  Sodium level was noted to be 126. Upon admission patient received IV fluids which improved renal function and sodium.  On the day of discharge her sodium was 132.  Medically stable for discharge.  Rest of the recommendations as stated above.     Assessment & Plan:  Principal Problem:   Hyponatremia Active Problems:   Infrarenal abdominal aortic aneurysm (AAA) without rupture (HCC)   AKI (acute kidney injury) (HCC)   Weakness   Elevated troponin       Assessment and Plan: * Hyponatremia -Suspicion for hypovolemic hyponatremia getting IV fluids.  After IV fluids during the hospitalization, today sodium is 132.  Stable for discharge.  Recommend repeat blood work with PCP in next 5 to 7 days.   Elevated troponin, demand Likely from dehydration.  Nonischemic   Weakness, resolved Generalized weakness from hyponatremia and dehydration.  Patient did well with physical therapy   AKI (acute kidney injury) ([place]) Baseline creatinine 1.2, today is 1.04.     Infrarenal abdominal aortic aneurysm (AAA) without rupture (HCC) -known infrarenal AAA now at  4 cm -recommend every 12 months imaging follow up       Body mass index is 24 kg/m.       Discharge Diagnoses:  Principal Problem:   Hyponatremia Active Problems:   Infrarenal abdominal aortic aneurysm (AAA) without rupture (HCC)   AKI (acute kidney injury) (HCC)   Weakness   Elevated troponin      Consultations: None  Subjective: Feels well no complaints.  Wishes to go home  Discharge Exam: Vitals:   04/09/22 0517 04/09/22 0733  BP: (!) 147/53 (!) 160/53  Pulse: 71 83  Resp: 16 18  Temp: 98.2 F (36.8 C) (!) 97.4 F (36.3 C)  SpO2: 95% 94%   Vitals:   04/08/22 1531 04/08/22 2133 04/09/22 0517 04/09/22 0733  BP: (!) 154/54 (!) 168/61 (!) 147/53 (!) 160/53  Pulse: 84 71 71 83  Resp: 16 16 16 18  $ Temp: 98.3 F (36.8 C) 99.1 F (37.3 C) 98.2 F (36.8 C) (!) 97.4 F (36.3 C)  TempSrc: Oral Oral Oral Oral  SpO2: 96% 95% 95% 94%  Weight:      Height:        General: Pt is alert, awake, not in acute distress Cardiovascular: RRR, S1/S2 +, no rubs, no gallops Respiratory: CTA bilaterally, no wheezing, no rhonchi Abdominal: Soft, NT, ND, bowel sounds + Extremities: no edema, no cyanosis  Discharge Instructions   Allergies as of 04/09/2022   No Known Allergies      Medication List     TAKE these medications    amLODipine 10 MG tablet Commonly known as: NORVASC Take  10 mg by mouth at bedtime.   atorvastatin 80 MG tablet Commonly known as: LIPITOR Take 80 mg by mouth at bedtime.   clopidogrel 75 MG tablet Commonly known as: PLAVIX Take 75 mg by mouth at bedtime.   diclofenac Sodium 1 % Gel Commonly known as: VOLTAREN Apply 4 g topically 4 (four) times daily. What changed:  when to take this reasons to take this   metFORMIN 500 MG tablet Commonly known as: GLUCOPHAGE Take 500 mg by mouth at bedtime.   TYLENOL 500 MG tablet Generic drug: acetaminophen Take 500 mg by mouth every 6 (six) hours as needed for mild pain.         Follow-up Information     Helane Rima, MD Follow up in 1 week(s).   Specialty: Family Medicine Contact information: [place] [id number] [phone]                No Known Allergies  You were cared for by a hospitalist during your hospital stay. If you have any questions about your discharge medications or the care you received while you were in the hospital after you are discharged, you can call the unit and asked to speak with the hospitalist on call if the hospitalist that took care of you is not available. Once you are discharged, your primary care physician will handle any further medical issues. Please note that no refills for any discharge medications will be authorized once you are discharged, as it is imperative that you return to your primary care physician (or establish a relationship with a primary care physician if you do not have one) for your aftercare needs so that they can reassess your need for medications and monitor your lab values.   Procedures/Studies: CT Renal Stone Study  Result Date: 04/07/2022 CLINICAL DATA:  Flank pain.  Concern for kidney stone. EXAM: CT ABDOMEN AND PELVIS WITHOUT CONTRAST TECHNIQUE: Multidetector CT imaging of the abdomen and pelvis was performed following the standard protocol without IV contrast. RADIATION DOSE REDUCTION: This exam was performed according to the departmental dose-optimization program which includes automated exposure control, adjustment of the mA and/or kV according to patient size and/or use of iterative reconstruction technique. COMPARISON:  None Available. FINDINGS: Evaluation of this exam is limited in the absence of intravenous contrast. Lower chest: Background of emphysema. There are bibasilar subpleural atelectasis. The visualized lung bases are otherwise clear. Coronary vascular calcification. No intra-abdominal free air or free fluid. Hepatobiliary: The liver is unremarkable.  No biliary dilatation. Multiple gallstones. No pericholecystic fluid or evidence of acute cholecystitis by CT. Ultrasound may provide better evaluation if there is clinical concern for acute cholecystitis. Pancreas: Unremarkable. No pancreatic ductal dilatation or surrounding inflammatory changes. Spleen: Normal in size without focal abnormality. Adrenals/Urinary Tract: The adrenal glands are unremarkable. There is no hydronephrosis or nephrolithiasis on either side. Renal vascular calcifications noted. The urinary bladder is unremarkable. Stomach/Bowel: There is sigmoid and distal colonic diverticulosis without active inflammatory changes. There is no bowel obstruction or active inflammation. There is a 5 cm duodenal diverticulum. The appendix is normal. Vascular/Lymphatic: Advanced aortoiliac atherosclerotic disease. There is a 4 cm fusiform infrarenal abdominal aortic aneurysm. There is narrowing of the iliac arteries with advanced atherosclerotic calcification. A femorofemoral bypass graft is noted. Evaluation of the vessels and graph is limited in the absence of intravenous contrast. The IVC is unremarkable. No portal venous gas. There is no adenopathy. Reproductive: The prostate gland is mildly enlarged  measuring 5 cm in transverse axial diameter. Other: None Musculoskeletal: Degenerative changes of the spine. No acute osseous pathology. IMPRESSION: 1. No acute intra-abdominal or pelvic pathology. No hydronephrosis or nephrolithiasis. 2. Cholelithiasis. 3. Colonic diverticulosis. No bowel obstruction. Normal appendix. 4. A 4 cm fusiform infrarenal abdominal aortic aneurysm. Recommend follow-up every 12 months and vascular consultation. This recommendation follows ACR consensus guidelines: White Paper of the ACR Incidental Findings Committee II on Vascular Findings. J Am Coll Radiol 2013JB:[id number]. 5.  Aortic Atherosclerosis (ICD10-I70.0). Electronically Signed   By: Anner Crete M.D.   On: 04/07/2022  22:41   DG Chest 2 View  Result Date: 04/07/2022 CLINICAL DATA:  Several day history of weakness EXAM: CHEST - 2 VIEW COMPARISON:  Chest radiograph dated 12/31/2017, CT chest dated 11/07/2008 FINDINGS: Slightly low lung volumes. Bibasilar patchy opacities. Lateral left upper lobe nodule is not substantially changed from 11/07/2008. No pleural effusion or pneumothorax. The heart size and mediastinal contours are within normal limits. The visualized skeletal structures are unremarkable. IMPRESSION: 1. Slightly low lung volumes. Bibasilar patchy opacities, likely atelectasis. 2. Lateral left upper lobe nodule is not substantially changed from 11/07/2008. Electronically Signed   By: Darrin Nipper M.D.   On: 04/07/2022 12:15   DG Shoulder Left  Result Date: 04/04/2022 CLINICAL DATA:  Chronic left shoulder pain EXAM: LEFT SHOULDER - 3 VIEW COMPARISON:  None Available. FINDINGS: Left rotator cuff calcification. No fracture, bone lesion, or glenohumeral joint space narrowing. Prominent degenerative spurring at the [facility] joint. IMPRESSION: 1. Acromioclavicular osteoarthritis. 2. Rotator cuff calcification. Electronically Signed   By: Jorje Guild M.D.   On: 04/04/2022 07:43     The results of significant diagnostics from this hospitalization (including imaging, microbiology, ancillary and laboratory) are listed below for reference.     Microbiology: No results found for this or any previous visit (from the past 240 hour(s)).   Labs: BNP (last 3 results) No results for input(s): "BNP" in the last 8760 hours. Basic Metabolic Panel: Recent Labs  Lab 04/07/22 1141 04/08/22 0148 04/08/22 1013 04/08/22 1820 04/09/22 0153 04/09/22 1006  NA 128* 127* 128* 130* 130* 132*  K 4.6 4.0 4.4 4.3 4.3 4.2  CL 96* 95* 97* 99 100 100  CO2 19* 22 20* 20* 20* 18*  GLUCOSE 113* 90 91 92 97 139*  BUN 21 25* 20 20 19 17  $ CREATININE 1.34* 1.37* 1.14 1.04 1.07 1.04  CALCIUM 9.2 8.9 9.3 9.0 9.0 9.0  MG 2.0  --   --   --   1.8  --   PHOS 2.5  --   --   --   --   --    Liver Function Tests: No results for input(s): "AST", "ALT", "ALKPHOS", "BILITOT", "PROT", "ALBUMIN" in the last 168 hours. No results for input(s): "LIPASE", "AMYLASE" in the last 168 hours. No results for input(s): "AMMONIA" in the last 168 hours. CBC: Recent Labs  Lab 04/06/22 0805 04/07/22 1141 04/08/22 0148 04/08/22 1013  WBC 10.8* 14.6* 10.8* 10.1  NEUTROABS 8.6*  --   --   --   HGB 13.4 13.3 12.3* 12.7*  HCT 39.9 40.6 36.6* 38.2*  MCV 84.5 86.0 84.9 84.1  PLT 283 304 278 302   Cardiac Enzymes: No results for input(s): "CKTOTAL", "CKMB", "CKMBINDEX", "TROPONINI" in the last 168 hours. BNP: Invalid input(s): "POCBNP" CBG: No results for input(s): "GLUCAP" in the last 168 hours. D-Dimer No results for input(s): "DDIMER" in the last 72 hours. Hgb A1c No  results for input(s): "HGBA1C" in the last 72 hours. Lipid Profile No results for input(s): "CHOL", "HDL", "LDLCALC", "TRIG", "CHOLHDL", "LDLDIRECT" in the last 72 hours. Thyroid function studies Recent Labs    04/07/22 1153  TSH 1.353   Anemia work up No results for input(s): "VITAMINB12", "FOLATE", "FERRITIN", "TIBC", "IRON", "RETICCTPCT" in the last 72 hours. Urinalysis    Component Value Date/Time   COLORURINE AMBER (A) 04/07/2022 1527   APPEARANCEUR HAZY (A) 04/07/2022 1527   LABSPEC 1.019 04/07/2022 1527   PHURINE 5.0 04/07/2022 1527   GLUCOSEU NEGATIVE 04/07/2022 1527   HGBUR SMALL (A) 04/07/2022 1527   BILIRUBINUR NEGATIVE 04/07/2022 1527   KETONESUR NEGATIVE 04/07/2022 1527   PROTEINUR NEGATIVE 04/07/2022 1527   UROBILINOGEN 1.0 06/18/2008 1452   NITRITE NEGATIVE 04/07/2022 1527   LEUKOCYTESUR NEGATIVE 04/07/2022 1527   Sepsis Labs Recent Labs  Lab 04/06/22 0805 04/07/22 1141 04/08/22 0148 04/08/22 1013  WBC 10.8* 14.6* 10.8* 10.1   Microbiology No results found for this or any previous visit (from the past 240 hour(s)).   Time coordinating  discharge:  I have spent 35 minutes face to face with the patient and on the ward discussing the patients care, assessment, plan and disposition with other care givers. >50% of the time was devoted counseling the patient about the risks and benefits of treatment/Discharge disposition and coordinating care.   SIGNED:   Damita Lack, MD  [facility] 04/09/2022, 12:56 PM   If 7PM-7AM, please contact night-coverage

## 2022-04-09 NOTE — Evaluation (Signed)
Occupational Therapy Evaluation Patient Details Name: Philip Gray MRN: [id number] DOB: 01/12/1934 Today's Date: 04/09/2022   History of Present Illness Patient is a 89 y/o male who presents on 2/7 for weakness. Recent visit to ED on 2/4 for Bil shoulder pain- xrays unremarkable. Found to have hyponatremia with AKI. PMH includes AAA. tobacco use, HTN, DM2, PVD, COPD, CKD.   Clinical Impression   PTA patient independent and driving. Admitted for above and presents with problem list below. Pt completing Adls, transfers and mobility in room with min guard to close supervision. Mild unsteadiness and encouraged pt to utilize RW initially at dc to optimize safety.  Cognition appears WFL. Based on performance today, anticipate no further needs after dc but will follow acutely.      Recommendations for follow up therapy are one component of a multi-disciplinary discharge planning process, led by the attending physician.  Recommendations may be updated based on patient status, additional functional criteria and insurance authorization.   Follow Up Recommendations  No OT follow up     Assistance Recommended at Discharge PRN  Patient can return home with the following Assistance with cooking/housework;A little help with bathing/dressing/bathroom;A little help with walking and/or transfers    Functional Status Assessment  Patient has had a recent decline in their functional status and demonstrates the ability to make significant improvements in function in a reasonable and predictable amount of time.  Equipment Recommendations  None recommended by OT    Recommendations for Other Services       Precautions / Restrictions Precautions Precautions: Fall Restrictions Weight Bearing Restrictions: No      Mobility Bed Mobility               General bed mobility comments: OOB in recliner    Transfers Overall transfer level: Needs assistance Equipment used: None Transfers: Sit to/from  Stand Sit to Stand: Min guard           General transfer comment: to close supervisoin for safety      Balance Overall balance assessment: Needs assistance Sitting-balance support: Feet supported, No upper extremity supported Sitting balance-Leahy Scale: Good     Standing balance support: No upper extremity supported, During functional activity Standing balance-Leahy Scale: Fair                             ADL either performed or assessed with clinical judgement   ADL Overall ADL's : Needs assistance/impaired     Grooming: Wash/dry hands;Supervision/safety;Standing           Upper Body Dressing : Set up;Sitting   Lower Body Dressing: Min guard;Sit to/from stand   Toilet Transfer: Min guard;Ambulation;Rolling walker (2 wheels)   Toileting- Clothing Manipulation and Hygiene: Supervision/safety;Sit to/from stand       Functional mobility during ADLs: Min guard       Vision   Vision Assessment?: No apparent visual deficits     Perception     Praxis      Pertinent Vitals/Pain Pain Assessment Pain Assessment: No/denies pain     Hand Dominance Right   Extremity/Trunk Assessment Upper Extremity Assessment Upper Extremity Assessment: Generalized weakness   Lower Extremity Assessment Lower Extremity Assessment: Defer to PT evaluation   Cervical / Trunk Assessment Cervical / Trunk Assessment: Kyphotic   Communication Communication Communication: HOH   Cognition Arousal/Alertness: Awake/alert Behavior During Therapy: WFL for tasks assessed/performed Overall Cognitive Status: Within Functional Limits for tasks assessed  General Comments: some difficulty sequencing months backwards but able to complete without assist, engages appropraitely     General Comments  VSS on RA    Exercises     Shoulder Instructions      Home Living Family/patient expects to be discharged to:: Private  residence Living Arrangements: Spouse/significant other Available Help at Discharge: Family;Available PRN/intermittently Type of Home: House Home Access: Stairs to enter Entrance Stairs-Number of Steps: 1 + 1   Home Layout: One level     Bathroom Shower/Tub: Occupational psychologist: Handicapped height     Home Equipment: [place] - single Barista (2 wheels);Grab bars - tub/shower   Additional Comments: Wife still works.      Prior Functioning/Environment Prior Level of Function : Independent/Modified Independent             Mobility Comments: independent, drives. Reports no falls. ADLs Comments: independent        OT Problem List: Decreased strength;Decreased activity tolerance;Impaired balance (sitting and/or standing);Decreased knowledge of use of DME or AE;Decreased knowledge of precautions      OT Treatment/Interventions: Self-care/ADL training;Therapeutic exercise;DME and/or AE instruction;Therapeutic activities;Balance training;Patient/family education    OT Goals(Current goals can be found in the care plan section) Acute Rehab OT Goals Patient Stated Goal: home OT Goal Formulation: With patient Time For Goal Achievement: 04/23/22 Potential to Achieve Goals: Good  OT Frequency: Min 2X/week    Co-evaluation              AM-PAC OT "6 Clicks" Daily Activity     Outcome Measure Help from another person eating meals?: None Help from another person taking care of personal grooming?: A Little Help from another person toileting, which includes using toliet, bedpan, or urinal?: A Little Help from another person bathing (including washing, rinsing, drying)?: A Little Help from another person to put on and taking off regular upper body clothing?: A Little Help from another person to put on and taking off regular lower body clothing?: A Little 6 Click Score: 19   End of Session Nurse Communication: Mobility status  Activity Tolerance:  Patient tolerated treatment well Patient left: in chair;with call bell/phone within reach  OT Visit Diagnosis: Other abnormalities of gait and mobility (R26.89);Muscle weakness (generalized) (M62.81)                Time: [id number] OT Time Calculation (min): 14 min Charges:  OT General Charges $OT Visit: 1 Visit OT Evaluation $OT Eval Moderate Complexity: 1 Mod  Jolaine Artist, OT Acute Rehabilitation Services Office [phone]   Delight Stare 04/09/2022, 11:40 AM

## 2022-04-09 NOTE — Progress Notes (Signed)
Patient discharged to home, AVS reviewed, IV removed, patient will call and schedule at f/u appointment with PCP. Volunteers assisted patient to the main entrance, patient called family for transportation.

## 2022-04-09 NOTE — Evaluation (Signed)
Physical Therapy Evaluation Patient Details Name: Philip Gray MRN: [id number] DOB: Apr 08, 1933 Today's Date: 04/09/2022  History of Present Illness  Patient is a 89 y/o male who presents on 2/7 for weakness. Recent visit to ED on 2/4 for Bil shoulder pain- xrays unremarkable. Found to have hyponatremia with AKI. PMH includes AAA. tobacco use, HTN, DM2, PVD, COPD, CKD.  Clinical Impression  Patient presents with generalized weakness, impaired balance and impaired mobility s/p above. Pt lives at home with his wife and reports being independent for ADLs at baseline as well as ambulation. Reports no falls. Today, pt tolerated transfers and gait training with min guard progressing to supervision for safety.  Used IV pole for support at this time. Encouraged using SPC vs RW at home initially until strength improves. VSS on RA. Will follow acutely to maximize independence and mobility prior to return home.     Recommendations for follow up therapy are one component of a multi-disciplinary discharge planning process, led by the attending physician.  Recommendations may be updated based on patient status, additional functional criteria and insurance authorization.  Follow Up Recommendations No PT follow up      Assistance Recommended at Discharge PRN  Patient can return home with the following  A little help with walking and/or transfers;A little help with bathing/dressing/bathroom;Assist for transportation;Assistance with cooking/housework    Equipment Recommendations None recommended by PT  Recommendations for Other Services       Functional Status Assessment Patient has had a recent decline in their functional status and demonstrates the ability to make significant improvements in function in a reasonable and predictable amount of time.     Precautions / Restrictions Precautions Precautions: Fall Restrictions Weight Bearing Restrictions: No      Mobility  Bed Mobility Overal bed  mobility: Modified Independent             General bed mobility comments: increased time, HOB flat, no assist needed.    Transfers Overall transfer level: Needs assistance Equipment used: None               General transfer comment: Stood from EOB x1, no assist needed. Transferred to chair post ambulation.    Ambulation/Gait Ambulation/Gait assistance: Supervision Gait Distance (Feet): 200 Feet Assistive device: IV Pole, None Gait Pattern/deviations: Step-through pattern, Decreased stride length, Drifts right/left Gait velocity: decreased Gait velocity interpretation: <1.31 ft/sec, indicative of household ambulator   General Gait Details: Slow, mildly unsteady gait reaching for furniture for support initially, holding onto IV pole in hallway. 1 standing rest break. Reports feeling slightly weaker than baseline due to laying in bed. No LOB.  Stairs            Wheelchair Mobility    Modified Rankin (Stroke Patients Only)       Balance Overall balance assessment: Needs assistance Sitting-balance support: Feet supported, No upper extremity supported Sitting balance-Leahy Scale: Good     Standing balance support: During functional activity Standing balance-Leahy Scale: Fair Standing balance comment: Able to stand statically but needs at least 1 UE support for walking                             Pertinent Vitals/Pain Pain Assessment Pain Assessment: No/denies pain    Home Living Family/patient expects to be discharged to:: Private residence Living Arrangements: Spouse/significant other Available Help at Discharge: Family;Available PRN/intermittently Type of Home: House Home Access: Stairs to enter   [employer] of  Steps: 1 + 1   Home Layout: One level Home Equipment: Cane - single point;Rolling Walker (2 wheels) Additional Comments: Wife still works.    Prior Function Prior Level of Function : Independent/Modified  Independent             Mobility Comments: independent, drives. Reports no falls. ADLs Comments: independent     Hand Dominance   Dominant Hand: Right    Extremity/Trunk Assessment   Upper Extremity Assessment Upper Extremity Assessment: Defer to OT evaluation    Lower Extremity Assessment Lower Extremity Assessment: Generalized weakness (but functional)    Cervical / Trunk Assessment Cervical / Trunk Assessment: Kyphotic  Communication   Communication: No difficulties  Cognition Arousal/Alertness: Awake/alert Behavior During Therapy: WFL for tasks assessed/performed Overall Cognitive Status: Within Functional Limits for tasks assessed                                 General Comments: laughs appropriately at jokes, needs repetition at times due to [facility]. Cognition appropriate for functional mobility tasks.        General Comments General comments (skin integrity, edema, etc.): VSS on RA    Exercises     Assessment/Plan    PT Assessment Patient needs continued PT services  PT Problem List Decreased strength;Decreased mobility;Decreased balance       PT Treatment Interventions Therapeutic activities;Gait training;Therapeutic exercise;Patient/family education;Balance training;Functional mobility training;DME instruction    PT Goals (Current goals can be found in the Care Plan section)  Acute Rehab PT Goals Patient Stated Goal: to go home today PT Goal Formulation: With patient Time For Goal Achievement: 04/23/22 Potential to Achieve Goals: Good    Frequency Min 3X/week     Co-evaluation               AM-PAC PT "6 Clicks" Mobility  Outcome Measure Help needed turning from your back to your side while in a flat bed without using bedrails?: None Help needed moving from lying on your back to sitting on the side of a flat bed without using bedrails?: None Help needed moving to and from a bed to a chair (including a wheelchair)?: A  Little Help needed standing up from a chair using your arms (e.g., wheelchair or bedside chair)?: A Little Help needed to walk in hospital room?: A Little Help needed climbing 3-5 steps with a railing? : A Little 6 Click Score: 20    End of Session Equipment Utilized During Treatment: Gait belt Activity Tolerance: Patient tolerated treatment well Patient left: in chair;with call bell/phone within reach;Other (comment) (with lab in room upon departure) Nurse Communication: Mobility status PT Visit Diagnosis: Unsteadiness on feet (R26.81);Muscle weakness (generalized) (M62.81)    Time: 0950-1005 PT Time Calculation (min) (ACUTE ONLY): 15 min   Charges:   PT Evaluation $PT Eval Low Complexity: 1 Low          Marisa Severin, PT, DPT Acute Rehabilitation Services Secure chat preferred Office [place] 04/09/2022, 10:23 AM

## 2022-06-06 ENCOUNTER — Other Ambulatory Visit: Payer: Self-pay

## 2022-06-06 ENCOUNTER — Emergency Department (HOSPITAL_COMMUNITY)
Admission: EM | Admit: 2022-06-06 | Discharge: 2022-06-06 | Disposition: A | Discharge status: Home / Self Care | Payer: Medicare Other | Attending: Emergency Medicine | Admitting: Emergency Medicine

## 2022-06-06 ENCOUNTER — Emergency Department (HOSPITAL_COMMUNITY): Payer: Medicare Other

## 2022-06-06 DIAGNOSIS — M79641 Pain in right hand: Secondary | ICD-10-CM | POA: Diagnosis present

## 2022-06-06 DIAGNOSIS — J449 Chronic obstructive pulmonary disease, unspecified: Secondary | ICD-10-CM | POA: Diagnosis not present

## 2022-06-06 DIAGNOSIS — Z7902 Long term (current) use of antithrombotics/antiplatelets: Secondary | ICD-10-CM | POA: Insufficient documentation

## 2022-06-06 MED ORDER — COLCHICINE 0.6 MG PO TABS: 0.6000 mg | ORAL_TABLET | Freq: Two times a day (BID) | ORAL | 0 refills | Status: DC

## 2022-06-06 MED ORDER — ACETAMINOPHEN 325 MG PO TABS
650.0000 mg | ORAL_TABLET | Freq: Once | ORAL | Status: AC
  Administered 2022-06-06: 650 mg via ORAL
  Filled 2022-06-06: qty 2

## 2022-06-06 MED ORDER — COLCHICINE 0.6 MG PO TABS
0.6000 mg | ORAL_TABLET | Freq: Once | ORAL | Status: AC
  Administered 2022-06-06: 0.6 mg via ORAL
  Filled 2022-06-06: qty 1

## 2022-06-06 NOTE — ED Triage Notes (Signed)
Pt states R hand pain and swelling since Thursday.  No obvious injury.  Hx of arthritis.

## 2022-06-06 NOTE — ED Provider Notes (Signed)
[place] EMERGENCY DEPARTMENT AT [facility] Provider Note   CSN: [id number] Arrival date & time: 06/06/22  1105     History  Chief Complaint  Patient presents with   Hand Pain    Philip Gray is a 89 y.o. male.  With history of hyperlipidemia, COPD, arthritis, peripheral vascular disease, diabetes who presents to the ED for evaluation of right hand pain.  States he noticed this on Thursday and it has progressively gotten worse.  He denies recent trauma, wounds, falls.  Believes it may be due to his arthritis, however he typically only has pain in his pointer finger and now has pain in the MCP joints of all fingers on the right hand.  Denies history of gout.  He denies fevers, chills, nausea, vomiting.  He has been using Voltaren gel with minimal relief.  He has not taken any pain medications at home.  He does not have pain in the wrist, elbow or contralateral upper extremity.  Currently rates his pain at an 8 out of 10   Hand Pain       Home Medications Prior to Admission medications   Medication Sig Start Date End Date Taking? Authorizing Provider  colchicine 0.6 MG tablet Take 1 tablet (0.6 mg total) by mouth 2 (two) times daily for 5 days. 06/06/22 06/11/22 Yes Nikola Marone, Edsel Petrin, PA-C  amLODipine (NORVASC) 10 MG tablet Take 10 mg by mouth at bedtime.    [provider]  atorvastatin (LIPITOR) 80 MG tablet Take 80 mg by mouth at bedtime.     [provider]  clopidogrel (PLAVIX) 75 MG tablet Take 75 mg by mouth at bedtime.  11/13/13   [provider]  diclofenac Sodium (VOLTAREN) 1 % GEL Apply 4 g topically 4 (four) times daily. Patient taking differently: Apply 4 g topically 4 (four) times daily as needed (to affected site- for pain). 04/04/22   Melene Plan, DO  metFORMIN (GLUCOPHAGE) 500 MG tablet Take 500 mg by mouth at bedtime. 01/27/22   [provider]  TYLENOL 500 MG tablet Take 500 mg by mouth every 6 (six) hours as needed for mild  pain.    [provider]      Allergies    Patient has no known allergies.    Review of Systems   Review of Systems  Musculoskeletal:  Positive for arthralgias.  All other systems reviewed and are negative.   Physical Exam Updated Vital Signs BP (!) 153/65   Pulse 81   Temp 98 F (36.7 C) (Oral)   Resp 16   SpO2 100%  Physical Exam Vitals and nursing note reviewed.  Constitutional:      General: He is not in acute distress.    Appearance: Normal appearance. He is normal weight. He is not ill-appearing.  HENT:     Head: Normocephalic and atraumatic.  Cardiovascular:     Pulses:          Radial pulses are 2+ on the right side and 2+ on the left side.  Pulmonary:     Effort: Pulmonary effort is normal. No respiratory distress.  Abdominal:     General: Abdomen is flat.  Musculoskeletal:        General: Normal range of motion.     Cervical back: Neck supple.     Comments: Mild swelling overlying the MCP joints of the right second through fifth fingers.  No warmth or erythema.  Mild TTP.  Grip strength and wrist motion  decreased secondary to pain.  No anatomical snuffbox tenderness.  Skin:    General: Skin is warm and dry.     Capillary Refill: Capillary refill takes less than 2 seconds.     Comments: No rashes or wounds to the right hand  Neurological:     Mental Status: He is alert and oriented to person, place, and time.     Comments: Sensation intact in all fingers  Psychiatric:        Mood and Affect: Mood normal.        Behavior: Behavior normal.     ED Results / Procedures / Treatments   Labs (all labs ordered are listed, but only abnormal results are displayed) Labs Reviewed - No data to display  EKG None  Radiology DG Hand Complete Right  Result Date: 06/06/2022 CLINICAL DATA:  Atraumatic right hand pain EXAM: RIGHT HAND - COMPLETE 3+ VIEW COMPARISON:  None Available. FINDINGS: No acute fracture or dislocation. Severe arthropathy of the wrist,  most pronounced at the radiocarpal and first CMC joints. Chondrocalcinosis within the wrist and several joints within the hand. No focal soft tissue swelling. IMPRESSION: 1. No acute osseous abnormality of the right hand. 2. Severe degenerative changes of the hand and wrist, most pronounced at the radiocarpal and first CMC joints. Appearance suggest underlying CPPD arthropathy. Electronically Signed   By: Duanne Guess D.O.   On: 06/06/2022 12:07    Procedures Procedures    Medications Ordered in ED Medications  colchicine tablet 0.6 mg (has no administration in time range)  acetaminophen (TYLENOL) tablet 650 mg (650 mg Oral Given 06/06/22 1129)    ED Course/ Medical Decision Making/ A&P Clinical Course as of 06/06/22 1253  Sun Jun 06, 2022  441 89 year old male brought in by family for evaluation of right hand pain and swelling mostly at his second and third MCPs.  No trauma.  He has had similar problems with other joints.  X-ray showing degenerative changes.  Will treat as arthritis probable gout. [MB]    Clinical Course User Index [MB] Terrilee Files, MD                             Medical Decision Making Amount and/or Complexity of Data Reviewed Radiology: ordered.  Risk OTC drugs.  This patient presents to the ED for concern of right hand pain, this involves an extensive number of treatment options.  The differential diagnosis includes osteoarthritis, rheumatoid arthritis, gout, pseudogout, septic arthritis, sprain, strain, fracture  Co morbidities that complicate the patient evaluation  hyperlipidemia, COPD, arthritis, peripheral vascular disease, diabetes  My initial workup includes pain control, x-ray right hand  Additional history obtained from: Nursing notes from this visit. Family significant other is present and provides a portion of history  I ordered imaging studies including x-ray right hand I independently visualized and interpreted imaging which showed  no acute osseous abnormalities.  Findings consistent with pseudogout arthropathy I agree with the radiologist interpretation  Afebrile, hemodynamically stable.  89 year old male presenting to the ED for evaluation of right hand pain.  This is atraumatic.  There is a small amount of swelling to the MCP joints without overlying warmth or erythema.  His neurovascular status is intact.  He has not taken any oral pain medications prior to arrival.  X-ray was significant for likely pseudogout arthropathy.  Low suspicion for septic arthritis given reassuring exam.  He was concerned about his kidney function,  however on review of labs he has had numerous GFRs greater than 60. Offered low dose colchicine and instructed patient to ice and rest the hand. Recommended close follow up with primary care provider and possible orthopedic referral if symptoms do not improve. He was given return precautions. Stable at discharge.  At this time there does not appear to be any evidence of an acute emergency medical condition and the patient appears stable for discharge with appropriate outpatient follow up. Diagnosis was discussed with patient who verbalizes understanding of care plan and is agreeable to discharge. I have discussed return precautions with patient and wife who verbalizes understanding. Patient encouraged to follow-up with their PCP within 1 week. All questions answered.  Patient's case discussed with Dr. Charm BargesButler who agrees with plan to discharge with follow-up.   Note: Portions of this report may have been transcribed using voice recognition software. Every effort was made to ensure accuracy; however, inadvertent computerized transcription errors may still be present.        Final Clinical Impression(s) / ED Diagnoses Final diagnoses:  Right hand pain    Rx / DC Orders ED Discharge Orders          Ordered    colchicine 0.6 MG tablet  2 times daily        06/06/22 1230               Michelle PiperSchutt, Nastasia Kage M, Cordelia Poche-C 06/06/22 1253    Terrilee FilesButler, Michael C, MD 06/07/22 [phone]

## 2022-06-06 NOTE — Discharge Instructions (Addendum)
You have been seen today for your complaint of right hand pain. Your imaging showed that your pain is likely from pseudogout. Your discharge medications include colchicine. Take this 3 times per day for the next 5 days. Home care instructions are as follows:  Ice the hand for 15 minutes at a time, multiple times per day Follow up with: your primary care provider in one week for reevaluation Please seek immediate medical care if you develop any of the following symptoms: Your hand is extremely swollen or is an unusual shape. Your hand or fingers turn white or blue. You cannot move your hand, wrist, or fingers. At this time there does not appear to be the presence of an emergent medical condition, however there is always the potential for conditions to change. Please read and follow the below instructions.  Do not take your medicine if  develop an itchy rash, swelling in your mouth or lips, or difficulty breathing; call 911 and seek immediate emergency medical attention if this occurs.  You may review your lab tests and imaging results in their entirety on your MyChart account.  Please discuss all results of fully with your primary care provider and other specialist at your follow-up visit.  Note: Portions of this text may have been transcribed using voice recognition software. Every effort was made to ensure accuracy; however, inadvertent computerized transcription errors may still be present.

## 2022-11-06 ENCOUNTER — Emergency Department (HOSPITAL_COMMUNITY)
Admission: EM | Admit: 2022-11-06 | Discharge: 2022-11-06 | Disposition: A | Discharge status: Home / Self Care | Payer: Medicare Other | Attending: Student | Admitting: Student

## 2022-11-06 ENCOUNTER — Encounter (HOSPITAL_COMMUNITY): Payer: Self-pay

## 2022-11-06 ENCOUNTER — Other Ambulatory Visit: Payer: Self-pay

## 2022-11-06 DIAGNOSIS — R2231 Localized swelling, mass and lump, right upper limb: Secondary | ICD-10-CM | POA: Diagnosis present

## 2022-11-06 DIAGNOSIS — Z7984 Long term (current) use of oral hypoglycemic drugs: Secondary | ICD-10-CM | POA: Insufficient documentation

## 2022-11-06 DIAGNOSIS — E119 Type 2 diabetes mellitus without complications: Secondary | ICD-10-CM | POA: Insufficient documentation

## 2022-11-06 DIAGNOSIS — M109 Gout, unspecified: Secondary | ICD-10-CM | POA: Insufficient documentation

## 2022-11-06 LAB — BASIC METABOLIC PANEL
Anion gap: 14 (ref 5–15)
BUN: 16 mg/dL (ref 8–23)
CO2: 20 mmol/L — ABNORMAL LOW (ref 22–32)
Calcium: 9.5 mg/dL (ref 8.9–10.3)
Chloride: 96 mmol/L — ABNORMAL LOW (ref 98–111)
Creatinine, Ser: 1.07 mg/dL (ref 0.61–1.24)
GFR, Estimated: >60 mL/min (ref >60)
Glucose, Bld: 140 mg/dL — ABNORMAL HIGH (ref 70–99)
Potassium: 4.2 mmol/L (ref 3.5–5.1)
Sodium: 130 mmol/L — ABNORMAL LOW (ref 135–145)

## 2022-11-06 LAB — CBC WITH DIFFERENTIAL/PLATELET
Abs Immature Granulocytes: 0.06 10*3/uL (ref 0.00–0.07)
Basophils Absolute: 0 10*3/uL (ref 0.0–0.1)
Basophils Relative: 0 %
Eosinophils Absolute: 0.2 10*3/uL (ref 0.0–0.5)
Eosinophils Relative: 2 %
HCT: 38.7 % — ABNORMAL LOW (ref 39.0–52.0)
Hemoglobin: 12.6 g/dL — ABNORMAL LOW (ref 13.0–17.0)
Immature Granulocytes: 1 %
Lymphocytes Relative: 12 %
Lymphs Abs: 1.2 10*3/uL (ref 0.7–4.0)
MCH: 27.7 pg (ref 26.0–34.0)
MCHC: 32.6 g/dL (ref 30.0–36.0)
MCV: 85.1 fL (ref 80.0–100.0)
Monocytes Absolute: 0.7 10*3/uL (ref 0.1–1.0)
Monocytes Relative: 7 %
Neutro Abs: 8.5 10*3/uL — ABNORMAL HIGH (ref 1.7–7.7)
Neutrophils Relative %: 78 %
Platelets: 249 10*3/uL (ref 150–400)
RBC: 4.55 MIL/uL (ref 4.22–5.81)
RDW: 15.6 % — ABNORMAL HIGH (ref 11.5–15.5)
WBC: 10.6 10*3/uL — ABNORMAL HIGH (ref 4.0–10.5)
nRBC: 0 % (ref 0.0–0.2)

## 2022-11-06 LAB — URIC ACID: Uric Acid, Serum: 4.5 mg/dL (ref 3.7–8.6)

## 2022-11-06 LAB — MAGNESIUM: Magnesium: 1.7 mg/dL (ref 1.7–2.4)

## 2022-11-06 MED ORDER — COLCHICINE 0.6 MG PO TABS
0.6000 mg | ORAL_TABLET | Freq: Once | ORAL | Status: AC
  Administered 2022-11-06: 0.6 mg via ORAL
  Filled 2022-11-06: qty 1

## 2022-11-06 MED ORDER — METHYLPREDNISOLONE 4 MG PO TBPK: ORAL_TABLET | ORAL | 0 refills | Status: DC

## 2022-11-06 NOTE — Discharge Instructions (Signed)
See your doctor in one week for recheck. Take the Medrol dose pack as directed.   Return to the ED if you develop any severe pain, significantly worse swelling, fever or new concern.

## 2022-11-06 NOTE — ED Provider Notes (Signed)
[place] EMERGENCY DEPARTMENT AT [facility] Provider Note   CSN: [id number] Arrival date & time: 11/06/22  0719     History  Chief Complaint  Patient presents with   hand swelling    Philip Gray is a 89 y.o. male.  Patient with history of HLD, T2DM, gout presents with pain and swelling in the left hand that started 2 days ago, and similar symptoms in the right hand yesterday. No fever, no injury. Per his wife, he had the same swelling earlier in the year diagnosed as gout. He is not taking anything regularly for this. No nausea, vomiting.   The history is provided by the patient and the spouse. No language interpreter was used.       Home Medications Prior to Admission medications   Medication Sig Start Date End Date Taking? Authorizing Provider  methylPREDNISolone (MEDROL DOSEPAK) 4 MG TBPK tablet Take as prescribed 11/06/22  Yes Kommor, Madison, MD  amLODipine (NORVASC) 10 MG tablet Take 10 mg by mouth at bedtime.    [provider]  atorvastatin (LIPITOR) 80 MG tablet Take 80 mg by mouth at bedtime.     [provider]  clopidogrel (PLAVIX) 75 MG tablet Take 75 mg by mouth at bedtime.  11/13/13   [provider]  colchicine 0.6 MG tablet Take 1 tablet (0.6 mg total) by mouth 2 (two) times daily for 5 days. 06/06/22 06/11/22  Schutt, Edsel Petrin, PA-C  diclofenac Sodium (VOLTAREN) 1 % GEL Apply 4 g topically 4 (four) times daily. Patient taking differently: Apply 4 g topically 4 (four) times daily as needed (to affected site- for pain). 04/04/22   Melene Plan, DO  metFORMIN (GLUCOPHAGE) 500 MG tablet Take 500 mg by mouth at bedtime. 01/27/22   [provider]  TYLENOL 500 MG tablet Take 500 mg by mouth every 6 (six) hours as needed for mild pain.    [provider]      Allergies    Patient has no known allergies.    Review of Systems   Review of Systems  Physical Exam Updated Vital Signs BP (!) 157/59   Pulse 82   Temp  98.3 F (36.8 C) (Oral)   Resp 16   Ht 5\' 10"  (1.778 m)   Wt 56.7 kg   SpO2 99%   BMI 17.94 kg/m  Physical Exam Vitals and nursing note reviewed.  Constitutional:      General: He is not in acute distress.    Appearance: Normal appearance.  Pulmonary:     Effort: Pulmonary effort is normal.  Musculoskeletal:     Comments: Right hand swollen dorsally over radial wrist, 4th MCP. No redness or warmth. Maintains FROM in bilateral hands. Left hand is not swollen. There is tenderness in bilateral dorsal hands. Cap RF <2s.   Neurological:     Mental Status: He is alert.     ED Results / Procedures / Treatments   Labs (all labs ordered are listed, but only abnormal results are displayed) Labs Reviewed  BASIC METABOLIC PANEL - Abnormal; Notable for the following components:      Result Value   Sodium 130 (*)    Chloride 96 (*)    CO2 20 (*)    Glucose, Bld 140 (*)    All other components within normal limits  CBC WITH DIFFERENTIAL/PLATELET - Abnormal; Notable for the following components:   WBC 10.6 (*)    Hemoglobin 12.6 (*)    HCT 38.7 (*)  RDW 15.6 (*)    Neutro Abs 8.5 (*)    All other components within normal limits  MAGNESIUM  URIC ACID    EKG None  Radiology No results found.  Procedures Procedures    Medications Ordered in ED Medications  colchicine tablet 0.6 mg (0.6 mg Oral Given 11/06/22 0809)    ED Course/ Medical Decision Making/ A&P Clinical Course as of 11/06/22 0935  Sat Nov 06, 2022  0810 Patient to ED with pain bilateral hands as detailed in the HPI. He is very well appearing. VSS, afebrile. Denies systemic symptoms. History of gout. Labs pending. Colchicine oredered for pain. [SU]  [id number] Patient feels improved with colchicine. Labs reviewed and are essentially negative. Mild hyponatremia. Uric acid negative, however, with history gout in same location, improvement with colchicine, feel gout likely. He is felt appropriate for discharge home.  Encouraged close follow up with PCP.  [SU]    Clinical Course User Index [SU] Elpidio Anis, PA-C                                 Medical Decision Making Amount and/or Complexity of Data Reviewed Labs: ordered.  Risk Prescription drug management.           Final Clinical Impression(s) / ED Diagnoses Final diagnoses:  Acute gout of right hand, unspecified cause    Rx / DC Orders ED Discharge Orders          Ordered    methylPREDNISolone (MEDROL DOSEPAK) 4 MG TBPK tablet        11/06/22 0933              Elpidio Anis, PA-C 11/06/22 0935    Kommor, Madison, MD 11/06/22 [phone]

## 2022-11-06 NOTE — ED Triage Notes (Signed)
Pt arrived POV from home c/o right hand swelling and pain it first started Tuesday with his left hand and it went away and today it is his right hand.

## 2023-02-18 ENCOUNTER — Encounter: Payer: Self-pay | Admitting: Emergency Medicine

## 2024-02-28 ENCOUNTER — Ambulatory Visit (INDEPENDENT_AMBULATORY_CARE_PROVIDER_SITE_OTHER)

## 2024-02-28 ENCOUNTER — Ambulatory Visit: Payer: Self-pay

## 2024-02-28 VITALS — BP 178/60 | HR 66 | Ht 66.0 in | Wt 127.6 lb

## 2024-02-28 DIAGNOSIS — I7143 Infrarenal abdominal aortic aneurysm, without rupture: Secondary | ICD-10-CM

## 2024-02-28 DIAGNOSIS — E1169 Type 2 diabetes mellitus with other specified complication: Secondary | ICD-10-CM

## 2024-02-28 DIAGNOSIS — E785 Hyperlipidemia, unspecified: Secondary | ICD-10-CM | POA: Diagnosis not present

## 2024-02-28 DIAGNOSIS — E1159 Type 2 diabetes mellitus with other circulatory complications: Secondary | ICD-10-CM | POA: Diagnosis not present

## 2024-02-28 DIAGNOSIS — I10 Essential (primary) hypertension: Secondary | ICD-10-CM | POA: Insufficient documentation

## 2024-02-28 DIAGNOSIS — Z122 Encounter for screening for malignant neoplasm of respiratory organs: Secondary | ICD-10-CM | POA: Diagnosis not present

## 2024-02-28 DIAGNOSIS — I152 Hypertension secondary to endocrine disorders: Secondary | ICD-10-CM | POA: Diagnosis not present

## 2024-02-28 DIAGNOSIS — M810 Age-related osteoporosis without current pathological fracture: Secondary | ICD-10-CM | POA: Insufficient documentation

## 2024-02-28 DIAGNOSIS — Z0000 Encounter for general adult medical examination without abnormal findings: Secondary | ICD-10-CM | POA: Diagnosis not present

## 2024-02-28 DIAGNOSIS — N1831 Chronic kidney disease, stage 3a: Secondary | ICD-10-CM | POA: Insufficient documentation

## 2024-02-28 DIAGNOSIS — Z23 Encounter for immunization: Secondary | ICD-10-CM

## 2024-02-28 LAB — POCT GLYCOSYLATED HEMOGLOBIN (HGB A1C): HbA1c, POC (controlled diabetic range): 5.9 % (ref 0.0–7.0)

## 2024-02-28 NOTE — Patient Instructions (Addendum)
 Thank you for visiting the clinic today, it was good to see you!  Please always bring your medication bottles  In today's visit we discussed:  Blood pressure: Take your blood pressure medications every day, please record the blood pressures each morning and at least 1 other time throughout the day after resting comfortably for at least 5 minutes.  I recommend taking the blood pressure first thing when you wake up while standing.  Record the readings and we will have you come back in about 3 weeks to adjust medications as needed.  Diabetes: Great job on controlling your diabetes, it is well within range and this is likely due to your efforts of eating healthy and taking your metformin daily.  Keep up the great work!  Health maintenance: I have ordered a low-dose chest CT for surveillance of possible lung cancers.  You are also due for follow-up imaging of your abdominal aortic aneurysm, the enlarging of the largest blood vessel in your body.  We can conduct both the studies at the same time.  I advise you to try to quit smoking or at least cutting back on smoking is much as possible to reduce the risk of worsening lung disease or cancer.  Please follow-up in 3 weeks  For any questions, please call the office at [phone] or send me a message in MyChart. Have a great day!  -Fairy Amy, MD  [facility] Resident, PGY-1

## 2024-02-28 NOTE — Progress Notes (Signed)
" ° ° °  SUBJECTIVE:   Chief compliant/HPI: annual examination  Philip Gray is a 89 y.o. who presents today for an annual exam.   History tabs reviewed and updated, forgot his med list at home and could not remember the names or doses of what he is taking.   Review of systems form reviewed and notable for no symptoms of concern.   OBJECTIVE:   BP (!) 197/58   Pulse 78   Ht 5' 6 (1.676 m)   Wt 127 lb 9.6 oz (57.9 kg)   SpO2 98%   BMI 20.60 kg/m    General: Resting comfortably in no acute distress  Neck: Supple, full range of motion, trachea midline, thyroid nonenlarged, no palpable nodules Lymphadenopathy: No lymphadenopathy of anterior, posterior cervical chain, supraclavicular, retroauricular, occipital lymph nodes.  Parotid, submental or salivary glands normal in size Cardiovascular: Regular rate rhythm, normal S1 and S2, no murmurs rubs or gallops Respiratory: Clear to auscultation bilaterally, no wheezes or crackles Abdomen: Active bowel sounds, nondistended, nontender, prominent abdominal aortic pulse approximately 4 cm in diameter. Extremities: Nonedematous, nontender bilateral lower and upper extremities   ASSESSMENT/PLAN:   Assessment & Plan Routine adult health maintenance Infrarenal abdominal aortic aneurysm (AAA) without rupture Screening for lung cancer Overall doing well with no concerns or complaints today.  Due for lung cancer screening and screening of infrarenal abdominal aortic aneurysm which was last found to be 4 cm in size approximately 2 years ago. Screening low-dose chest CT ordered, CT angio aorta ordered Hyperlipidemia associated with type 2 diabetes mellitus (HCC) Reports questionable compliance with atorvastatin , A1c today 5.9 and does endorse taking metformin nightly and eating healthy well-balanced diet. Continue metformin 500 mg nightly Continue atorvastatin  80 mg daily, can consider switching to rosuvastatin 40 mg given elevated LDL  8/25 Microalbumin pending Hypertension associated with diabetes (HCC) Poorly controlled, reports that he has only ever taken amlodipine  though he does have some recorded of being prescribed Losartan  100 mg.  Has been taking medication periodically.  Repeat BP 178/60 today Standing blood pressures twice a day, blood pressure log given Reinforced importance of daily compliance with amlodipine  10 mg Follow-up in 3 weeks, can consider adding back losartan  or referral to Dr. Ruthann for ambulatory monitoring. Plan for BMP next visit Encounter for immunization Received Influenza shot today Annual Examination  See AVS for age appropriate recommendations.  PHQ score 0, reviewed and discussed.  Blood pressure value is not at goal, discussed and will retake. Not taking amlodipine  daily.   Considered the following screening exams based upon USPSTF recommendations: Diabetes screening: ordered, A1C 5.9 today HIV testing:Declined Hepatitis C: discussed and declined Hepatitis B:discussed and declined Syphilis if at high risk: discussed and declined GC/CT not at high risk and not ordered. Lipid panel (nonfasting or fasting) discussed based upon AHA recommendations and recently completed and repeat not yet indicated.  Consider repeat every 4-6 years.  Reviewed risk factors for latent tuberculosis and not indicated  Cancer Screening Discussion  Lung cancer screening:discussed and ordered screening CT.  See documentation below regarding indications/risks/benefits.  Colorectal cancer screening: not applicable given age. SABRA  PSA discussed and after engaging in discussion of possible risks, benefits and complications of screening. PSA: not indicated Vaccinations Influenza, will plan to go to [facility] for Shingrix and TDAP.   Follow up in 3 to 4 weeks for blood pressure follow-up MyChart Activation: Already signed up   Fairy Amy, MD [facility]  "

## 2024-02-28 NOTE — Assessment & Plan Note (Addendum)
 Poorly controlled, reports that he has only ever taken amlodipine  though he does have some recorded of being prescribed Losartan  100 mg.  Has been taking medication periodically.  Repeat BP 178/60 today Standing blood pressures twice a day, blood pressure log given Reinforced importance of daily compliance with amlodipine  10 mg Follow-up in 3 weeks, can consider adding back losartan  or referral to Dr. Ruthann for ambulatory monitoring. Plan for BMP next visit

## 2024-02-28 NOTE — Assessment & Plan Note (Signed)
 Overall doing well with no concerns or complaints today.  Due for lung cancer screening and screening of infrarenal abdominal aortic aneurysm which was last found to be 4 cm in size approximately 2 years ago. Screening low-dose chest CT ordered, CT angio aorta ordered

## 2024-02-28 NOTE — Assessment & Plan Note (Signed)
 Reports questionable compliance with atorvastatin , A1c today 5.9 and does endorse taking metformin nightly and eating healthy well-balanced diet. Continue metformin 500 mg nightly Continue atorvastatin  80 mg daily, can consider switching to rosuvastatin 40 mg given elevated LDL 8/25 Microalbumin pending

## 2024-03-14 ENCOUNTER — Ambulatory Visit (HOSPITAL_COMMUNITY)

## 2024-03-22 ENCOUNTER — Ambulatory Visit: Payer: Self-pay

## 2024-03-22 VITALS — BP 144/63 | HR 72 | Ht 66.0 in | Wt 125.1 lb

## 2024-03-22 DIAGNOSIS — E1159 Type 2 diabetes mellitus with other circulatory complications: Secondary | ICD-10-CM | POA: Diagnosis not present

## 2024-03-22 DIAGNOSIS — Z23 Encounter for immunization: Secondary | ICD-10-CM | POA: Diagnosis not present

## 2024-03-22 DIAGNOSIS — I152 Hypertension secondary to endocrine disorders: Secondary | ICD-10-CM

## 2024-03-22 DIAGNOSIS — E1122 Type 2 diabetes mellitus with diabetic chronic kidney disease: Secondary | ICD-10-CM | POA: Insufficient documentation

## 2024-03-22 DIAGNOSIS — I10 Essential (primary) hypertension: Secondary | ICD-10-CM | POA: Diagnosis not present

## 2024-03-22 MED ORDER — AMLODIPINE BESYLATE 5 MG PO TABS: 5.0000 mg | ORAL_TABLET | Freq: Every day | ORAL | 3 refills | Status: AC

## 2024-03-22 NOTE — Assessment & Plan Note (Signed)
 Overall well-controlled, though blood pressure log found intermittent diastolic hypotension to low 50s for sitting blood pressures, patient remained asymptomatic with no history of falls or presyncopal symptoms.  Taking amlodipine  10 mg every other day, reports no concerns or issues with medication compliance or ADLs. Decrease amlodipine  from 10 mg to 5 mg daily.  Instructed patient to take 1/2 tablet of 10 mg while waiting for mail order prescription to arrive. Continue daily blood pressure monitoring while standing. BMP today to monitor electrolytes and for monitoring CKD. 2-week follow-up

## 2024-03-22 NOTE — Progress Notes (Signed)
" ° ° °  SUBJECTIVE:   CHIEF COMPLAINT / HPI:   ISH: Reports that is feeling good, no headaches, dizziness, light headed, falls, LOC, or tunnel vision. Has home BP log with him of sitting blood pressure readings. Photo in the chart, but average appears to be around 140's/high 50's and low 60's. Taking amlodipine  every other day, but no other issues with taking medication. Denies any difficulty remembering to take medications in the morning.  Tobacco Use Disorder: Not interested in quitting smoking at this time. Has tried quitting in the past off and on.  Health maintenance: Imaging has still not been done.  Patient was requesting COVID shot today.  PERTINENT  PMH / PSH: Pulmonary nodule, infrarenal abdominal aortic aneurysm without rupture, CAD, osteoporosis, CKD stage IIIa, type 2 diabetes mellitus, hyperlipidemia.  OBJECTIVE:   BP (!) 144/63   Pulse 72   Ht 5' 6 (1.676 m)   Wt 125 lb 2 oz (56.8 kg)   SpO2 98%   BMI 20.20 kg/m    Cardiac: Regular rate and rhythm. Normal S1/S2. No murmurs, rubs, or gallops appreciated. Lungs: Mild expiratory wheezes bilateral lower lobes, no work of breathing or shortness of breath Abdomen: Normoactive bowel sounds. No tenderness to deep or light palpation. No rebound or guarding.   Psych: Pleasant and appropriate    ASSESSMENT/PLAN:   Assessment & Plan Hypertension associated with diabetes (HCC) Hypertension, isolated systolic Overall well-controlled, though blood pressure log found intermittent diastolic hypotension to low 50s for sitting blood pressures, patient remained asymptomatic with no history of falls or presyncopal symptoms.  Taking amlodipine  10 mg every other day, reports no concerns or issues with medication compliance or ADLs. Decrease amlodipine  from 10 mg to 5 mg daily.  Instructed patient to take 1/2 tablet of 10 mg while waiting for mail order prescription to arrive. Continue daily blood pressure monitoring while standing. BMP  today to monitor electrolytes and for monitoring CKD. 2-week follow-up Encounter for immunization Received COVID vaccination today, reached out to [facility] regarding CTA abdomen and screening lung CT.     Fairy Amy, MD [facility] "

## 2024-03-22 NOTE — Patient Instructions (Signed)
 Thank you for visiting the clinic today, it was good to see you!  Please always bring your medication bottles  In today's visit we discussed:  Blood pressure: Great work on recording your blood pressures daily, overall they look good though you were taking the amlodipine  in a slightly unusual manner.  For that I would like to decrease the amlodipine  dose to 5 mg.  Currently you have 10 mg tablets at home, so please split them in half and take 1/2 tablet daily.  The 5 mg amlodipine  tablets should be arriving in the mail in the next couple of weeks.  Once they arrive please bring all of your medications with you at the 2-week follow-up and we can dispose of the 10 mg Tablets to avoid confusion.  Continue to record your blood pressures daily, and try to remember to take them while you are standing.  I know this is unusual but you have a unique type of high blood pressure in which the standing recordings will be more accurate.  We have also ordered some follow-up lab work to monitor your kidney function.  Please follow-up in 2 weeks  For any questions, please call the office at [phone] or send me a message in MyChart. Have a great day!  -Fairy Amy, MD  [facility] Resident, PGY-1

## 2024-03-22 NOTE — Addendum Note (Signed)
 Addended by: LORRANE PAC on: 03/22/2024 01:50 PM   Modules accepted: Orders

## 2024-03-23 ENCOUNTER — Ambulatory Visit: Payer: Self-pay

## 2024-03-23 LAB — BASIC METABOLIC PANEL WITH GFR
BUN/Creatinine Ratio: 14 (ref 10–24)
BUN: 16 mg/dL (ref 8–27)
CO2: 21 mmol/L (ref 20–29)
Calcium: 10 mg/dL (ref 8.6–10.2)
Chloride: 99 mmol/L (ref 96–106)
Creatinine, Ser: 1.13 mg/dL (ref 0.76–1.27)
Glucose: 76 mg/dL (ref 70–99)
Potassium: 4.8 mmol/L (ref 3.5–5.2)
Sodium: 135 mmol/L (ref 134–144)
eGFR: 63 mL/min/1.73

## 2024-03-27 NOTE — Telephone Encounter (Signed)
 Attempted to reach patient to set a good time and cay to  schedule CTA and CT scan. LVM for patient to call office back. Nelson Land, CMA

## 2024-03-27 NOTE — Telephone Encounter (Signed)
-----   Message from Fairy Amy sent at 03/22/2024 12:08 PM EST ----- Regarding: Screening imaging Good afternoon Margit!  Wanted to reach out to follow-up on if this patient was able to get scheduled for his screening chest CT for lung cancer as well as his screening CTA for his abdominal aortic aneurysm. I saw that the prior auth was approved for the CTA.  Thank you for your time!  Dr. Fairy Amy, MD [facility] Family Medicine Resident, PGY-1

## 2024-04-05 ENCOUNTER — Ambulatory Visit: Payer: Self-pay

## 2024-04-05 VITALS — BP 164/47 | HR 79 | Ht 66.0 in | Wt 125.2 lb

## 2024-04-05 DIAGNOSIS — J439 Emphysema, unspecified: Secondary | ICD-10-CM

## 2024-04-05 DIAGNOSIS — I10 Essential (primary) hypertension: Secondary | ICD-10-CM

## 2024-04-05 DIAGNOSIS — F17218 Nicotine dependence, cigarettes, with other nicotine-induced disorders: Secondary | ICD-10-CM

## 2024-04-05 NOTE — Patient Instructions (Addendum)
 Thank you for visiting the clinic today, it was good to see you!  Please always bring your medication bottles  In today's visit we discussed:  Blood pressure: Continue taking the amlodipine  5 mg once a day.  You should be getting the 5 mg tablets in the mail soon, if they do not arrive please let the clinic know.  In the meantime, continue to split your amlodipine  10 mg tablets in half so you are taking 1/2 tablet daily.  If you are feeling dizzy or lightheaded when you stand or move about the house, schedule visit with the clinic and resume recording your blood pressures every day.  Tobacco use: Consider cutting out just 1 cigarette a day at a time.  So if you are smoking 10 cigarettes a day, try smoking 9 cigarettes a day for a week or 2, and continue to decrease by 1 cigarette a day.  It is important to find other things to replace your time and keep you interested and stimulated.  Thrift shops have a good supply of puzzles that you could buy, Legos have improved considerably to be more tailored to adults and there are many fun and interesting options to build, additionally you could consider picking up a new hobby such as knitting or crocheting.  Many of the supplies are available at [employer] for a very low cost.  Please follow-up in 6 months  For any questions, please call the office at [phone] or send me a message in MyChart. Have a great day!  -Fairy Amy, MD  [facility] Resident, PGY-1

## 2024-04-05 NOTE — Assessment & Plan Note (Signed)
 Big discrepancy between home blood pressure monitoring and clinic readings.  Clinic has persistently found blood pressures ranging 160-170s / 40s, home blood pressure readings of found 120-150s /55s - 70s.  Reassuringly patient has remained asymptomatic but would like ambulatory monitoring just to confirm. Plan to refer to Dr. Koval for ambulatory monitoring, patient unfortunately left prior to scheduling follow-up visit with Dr. Koval, called and left voicemail. Continue amlodipine  5 mg daily

## 2024-04-05 NOTE — Progress Notes (Signed)
" ° ° °  SUBJECTIVE:   CHIEF COMPLAINT / HPI:   HTN: Reports he has been taking the 5mg  of amlodipine  every day. Brings BP log with him and home sitting BP have been 120-150/55-70's. Denies feeling light headed, dizzy, changes in vision, or symptoms of orthostatic hypotension.  Denies any fevers, chills, leg swelling, diarrhea, constipation, dysuria, incontinence, or dribbling.   ADLs: No difficulties with taking his meds, prepping meals, toileting, clothing himself, or getting around the house. Reports that his neighbor next door keeps him busy and they spend a lot of time together.  COPD: Diagnosed on PFT from 2010 which found FEV1 2.58 (91%) 57% with no improvement after B2 and DLC0 57. No changes with dyspnea, or SOB. Reports no change with sputum production or cough. Compliant with inhaler prescriptions.  TUD: Reports little desire to quit. Is smoking 1/2 pack/day which is down from a 1 pack a day approximately a year ago.   PERTINENT  PMH / PSH:   OBJECTIVE:   BP (!) 164/47   Pulse 79   Ht 5' 6 (1.676 m)   Wt 125 lb 3.2 oz (56.8 kg)   SpO2 96%   BMI 20.21 kg/m    Cardiac: Regular rate and rhythm. Normal S1/S2. No murmurs, rubs, or gallops appreciated. Lungs: Clear bilaterally to ascultation, no wheezes or crackles Abdomen: Normoactive bowel sounds. No tenderness to deep or light palpation. No rebound or guarding.   Psych: Pleasant and appropriate    ASSESSMENT/PLAN:   Assessment & Plan Hypertension, isolated systolic Big discrepancy between home blood pressure monitoring and clinic readings.  Clinic has persistently found blood pressures ranging 160-170s / 40s, home blood pressure readings of found 120-150s /55s - 70s.  Reassuringly patient has remained asymptomatic but would like ambulatory monitoring just to confirm. Plan to refer to Dr. Koval for ambulatory monitoring, patient unfortunately left prior to scheduling follow-up visit with Dr. Koval, called and left  voicemail. Continue amlodipine  5 mg daily Nicotine dependence, cigarettes, with other nicotine-induced disorders Currently smoking half pack a day which is reduced from 1 pack a day 1 year ago.  Discussed importance smoking cessation and patient expressed very little desire to quit and reports that the biggest difficulty is that he gets bored at home.  We discussed many other alternative activities to keep him busy. Counseled regarding reducing just 1 cigarette a day 1 week at a time. Connected with resources such as thrift shops to get puzzles and craft supplies to keep him entertained. COPD type A (HCC) Diagnosed in 2010 with a PFT which found FEV1 2.5 891% of expected and FEV1 over FVC of 57% with no improvement after bronchodilator.  Patient has had very few symptoms, only endorsing mild dyspnea with exertion such as going up stairs.  No history of COPD exacerbation in the past. Continue to Tropium 2 puffs daily. Can continue albuterol  2 puffs as needed, patient very rarely uses but requested to have available     Fairy Amy, MD [facility] "

## 2024-04-06 ENCOUNTER — Telehealth: Payer: Self-pay

## 2024-04-06 NOTE — Telephone Encounter (Signed)
 Tried contacting patient to different occasions regarding plan for ambulatory blood pressure monitoring with Dr. Koval.  Left voicemail and sent patient a letter with appointment time and reason for visit.  Dr. Fairy Amy, MD [facility] Resident, PGY-1

## 2024-04-06 NOTE — Telephone Encounter (Signed)
-----   Message from [facility] Reena S sent at 03/22/2024  1:31 PM EST ----- Regarding: RE: Screening imaging Hi Dr Lorrane and Nelson,  The screening CTA for his abdominal aortic aneurysm can be scheduled. The due to the patient's age he is not qualified for the CT LCS. You can order a CT chest and usually they can get approved.  Thank you!  Margit Dimes, CMA ----- Message ----- From: Lorrane Pac, MD Sent: 03/22/2024  12:10 PM EST To: Reena ONEIDA Dimes, CMA; Nelson Land, CMA Subject: Screening imaging                              Good afternoon Margit!  Wanted to reach out to follow-up on if this patient was able to get scheduled for his screening chest CT for lung cancer as well as his screening CTA for his abdominal aortic aneurysm. I saw that the prior auth was approved for the CTA.  Thank you for your time!  Dr. Pac Lorrane, MD [facility] Family Medicine Resident, PGY-1

## 2024-04-06 NOTE — Telephone Encounter (Signed)
 3rd attempt to reach patient to see when a good time and day is to schedule his CT Scan. LVM for his to call office to let us  know. Nelson Land, CMA

## 2024-04-11 ENCOUNTER — Ambulatory Visit: Admitting: Pharmacist

## 2024-04-19 ENCOUNTER — Ambulatory Visit: Admitting: Pharmacist
